# Patient Record
Sex: Male | Born: 1945
Health system: Southern US, Community
[De-identification: ages and names within clinical notes are randomized; demographics above are authoritative.]

## PROBLEM LIST (undated history)

## (undated) DIAGNOSIS — R200 Anesthesia of skin: Secondary | ICD-10-CM

## (undated) DIAGNOSIS — E7800 Pure hypercholesterolemia, unspecified: Secondary | ICD-10-CM

## (undated) DIAGNOSIS — K635 Polyp of colon: Secondary | ICD-10-CM

## (undated) DIAGNOSIS — Z800 Family history of malignant neoplasm of digestive organs: Secondary | ICD-10-CM

## (undated) DIAGNOSIS — Z524 Kidney donor: Secondary | ICD-10-CM

## (undated) DIAGNOSIS — I10 Essential (primary) hypertension: Secondary | ICD-10-CM

## (undated) HISTORY — PX: OTHER SURGICAL HISTORY: SHX169

## (undated) HISTORY — DX: Family history of malignant neoplasm of digestive organs: Z80.0

## (undated) HISTORY — DX: Polyp of colon: K63.5

## (undated) HISTORY — DX: Anesthesia of skin: R20.0

---

## 1994-04-24 HISTORY — PX: BACK SURGERY: SHX140

## 2000-11-09 ENCOUNTER — Ambulatory Visit: Admission: RE | Admit: 2000-11-09 | Discharge: 2000-11-09 | Payer: Self-pay | Admitting: Psychiatry

## 2001-04-24 DIAGNOSIS — Z524 Kidney donor: Secondary | ICD-10-CM

## 2001-04-24 HISTORY — PX: NEPHRECTOMY: SHX65

## 2001-04-24 HISTORY — DX: Kidney donor: Z52.4

## 2009-05-31 ENCOUNTER — Encounter: Payer: Self-pay | Admitting: Orthopedic Surgery

## 2009-06-02 ENCOUNTER — Ambulatory Visit: Payer: Self-pay | Admitting: Orthopedic Surgery

## 2009-06-02 DIAGNOSIS — M25569 Pain in unspecified knee: Secondary | ICD-10-CM

## 2009-06-02 DIAGNOSIS — M1710 Unilateral primary osteoarthritis, unspecified knee: Secondary | ICD-10-CM

## 2009-06-02 DIAGNOSIS — S83249A Other tear of medial meniscus, current injury, unspecified knee, initial encounter: Secondary | ICD-10-CM | POA: Insufficient documentation

## 2009-06-16 ENCOUNTER — Ambulatory Visit: Payer: Self-pay | Admitting: Orthopedic Surgery

## 2010-05-26 NOTE — Assessment & Plan Note (Signed)
Summary: RT KNEE PAIN,POPPING/NEEDS XRAYS/UHC/CAF   Vital Signs:  Patient profile:   65 year old male Pulse rate:   72 / minute Resp:     16 per minute  Vitals Entered By: Fuller Canada MD (June 02, 2009 11:11 AM)  Visit Type:  Initial Consult Referring Provider:  self Primary Provider:  Dr.Tapper  CC:  right knee pain.  History of Present Illness: I saw Timothy Schroeder in the office today for an initial visit.  He is a 65 years old man with the complaint of:  right knee pain.  Patient is on a camping trip he was on a sloped hill and doing some work.  Afterwards he noticed he couldn't bend his knee started having pain and swelling and he is now having a popping sensation.  His pain is moderate.  The swelling seems to be a little bit better.  Heat seemed to help more than ice.  The popping noise has him concerned.    Will have xrays today in our office.  Meds: Pravastatin, Lisinopril, Aspirin, Vitamin E, Fish oil, Vitamin B 12, Centrum Silver, Aleve as needed.  Allergies (verified): No Known Drug Allergies  Past History:  Past Medical History: htn cholesterol  Past Surgical History: back kidney donation  Family History: na  Social History: Patient is married.  retired no smoking one beer a week 2 cups of caffeine per day  Review of Systems General:  Denies weight loss, weight gain, fever, chills, and fatigue. Cardiac :  Denies chest pain, angina, heart attack, heart failure, poor circulation, blood clots, and phlebitis. Resp:  Denies short of breath, difficulty breathing, COPD, cough, and pneumonia. GI:  Denies nausea, vomiting, diarrhea, constipation, difficulty swallowing, ulcers, GERD, and reflux. GU:  Denies kidney failure, kidney transplant, kidney stones, burning, poor stream, testicular cancer, blood in urine, and . Neuro:  Complains of dizziness; denies headache, migraines, numbness, weakness, tremor, and unsteady walking. MS:  Complains of joint  pain; denies rheumatoid arthritis, joint swelling, gout, bone cancer, osteoporosis, and . Endo:  Denies thyroid disease, goiter, and diabetes. Psych:  Denies depression, mood swings, anxiety, panic attack, bipolar, and schizophrenia. Derm:  Denies eczema, cancer, and itching. EENT:  Denies poor vision, cataracts, glaucoma, poor hearing, vertigo, ears ringing, sinusitis, hoarseness, toothaches, and bleeding gums. Immunology:  Denies seasonal allergies, sinus problems, and allergic to bee stings. Lymphatic:  Denies lymph node cancer and lymph edema.  Physical Exam  Additional Exam:  vitals as recorded  Normal appearance  Peripheral vastus system observation normal palpation normal  Gait and station he is limping on the RIGHT side.  Inspection large joint effusion medial joint line tenderness lateral suprapatellar tenderness.  Flexion 115.  Stability normal.  Muscle strength and tone normal.  McMurray is negative, screw test positive.  Skin normal  Reflexes normal and both legs  Sensation normal  Mental status orientation normal mood and affect normal  Opposite LEFT knee has full range of motion strength is normal stability is normal and alignment is normal there is no effusion or tenderness. Development attrition normal body habitus medium deformities nonclimbing normal    Impression & Recommendations:  Problem # 1:  KNEE, ARTHRITIS, DEGEN./OSTEO (ICD-715.96) Assessment New  x-rays AP lateral patella RIGHT knee.  There is mild osteophytes and medial joint space narrowing is seen as well.  Impression mild osteoarthritis there is also some evidence of possible loose bodies in the medial compartment.  Recommend aspiration and injection RIGHT knee  We aspirated 20  cc of clear yellow fluid.  We injected cortisone.  Verbal consent was obtained. The knee was prepped with alcohol and ethyl chloride. 1 cc of depomedrol 40mg /cc and 4 cc of lidocaine 1% was injected. there were no  complications.  Orders: New Patient Level III [id number])  Problem # 2:  KNEE PAIN (ICD-719.46) Assessment: New  Orders: New Patient Level III (09811) Joint Aspirate / Injection, Large (20610) Depo- Medrol 40mg  (J1030) Knee x-ray,  3 views (91478)  Problem # 3:  DERANGEMENT MENISCUS (ICD-717.5) Assessment: New  Orders: New Patient Level III (29562) Joint Aspirate / Injection, Large (20610) Depo- Medrol 40mg  (J1030) Knee x-ray,  3 views (13086)  Patient Instructions: 1)  You have received an injection of cortisone today. You may experience increased pain at the injection site. Apply ice pack to the area for 20 minutes every 2 hours and take 2 xtra strength tylenol every 8 hours. This increased pain will usually resolve in 24 hours. The injection will take effect in 3-10 days.  2)  Ice 30 min nightly  3)  continue Aleve two times a day  4)  return in 2 weeks

## 2010-05-26 NOTE — Letter (Signed)
Summary: History form  History form   Imported By: Jacklynn Ganong 06/10/2009 08:10:40  _____________________________________________________________________  External Attachment:    Type:   Image     Comment:   External Document

## 2010-05-26 NOTE — Assessment & Plan Note (Signed)
Summary: 2 WK RE-CK RT KNEE/UHC/CAF   Visit Type:  Follow-up Referring Provider:  self Primary Provider:  Dr.Tapper  CC:  right knee pain.  History of Present Illness: I saw Timothy Schroeder in the office today for a 2 week  followup visit.  He is a 65 years old man with the complaint of:  right knee pain.  He says that his knee is a lot better   No catching locking or giving   EXAMINATION THE KNEE HAS NO SWELLING AND NO TENDERNESS  GAIT IS NORMAL  MCMURRAYS SIGN IS NEGATIVE  ROM IS FULL   IMPR: IMPROVED   PALN F/U as needed   Allergies: No Known Drug Allergies   Impression & Recommendations:  Problem # 1:  KNEE PAIN (ICD-719.46) Assessment Improved  Orders: Est. Patient Level II (78295)  Patient Instructions: 1)  Ice as needed  2)  aleve as needed  3)  f/u as needed

## 2012-09-15 ENCOUNTER — Encounter (HOSPITAL_COMMUNITY): Payer: Self-pay | Admitting: *Deleted

## 2012-09-15 ENCOUNTER — Emergency Department (HOSPITAL_COMMUNITY): Payer: Medicare Other

## 2012-09-15 ENCOUNTER — Emergency Department (HOSPITAL_COMMUNITY)
Admission: EM | Admit: 2012-09-15 | Discharge: 2012-09-15 | Disposition: A | Discharge status: Home / Self Care | Payer: Medicare Other | Attending: Emergency Medicine | Admitting: Emergency Medicine

## 2012-09-15 DIAGNOSIS — M25559 Pain in unspecified hip: Secondary | ICD-10-CM | POA: Insufficient documentation

## 2012-09-15 DIAGNOSIS — W57XXXA Bitten or stung by nonvenomous insect and other nonvenomous arthropods, initial encounter: Secondary | ICD-10-CM

## 2012-09-15 DIAGNOSIS — M130 Polyarthritis, unspecified: Secondary | ICD-10-CM

## 2012-09-15 HISTORY — DX: Essential (primary) hypertension: I10

## 2012-09-15 HISTORY — DX: Pure hypercholesterolemia, unspecified: E78.00

## 2012-09-15 HISTORY — DX: Kidney donor: Z52.4

## 2012-09-15 LAB — CBC WITH DIFFERENTIAL/PLATELET
Basophils Absolute: 0 10*3/uL (ref 0.0–0.1)
Basophils Relative: 0 % (ref 0–1)
Eosinophils Absolute: 0.1 10*3/uL (ref 0.0–0.7)
MCH: 32.2 pg (ref 26.0–34.0)
MCHC: 34.9 g/dL (ref 30.0–36.0)
Monocytes Absolute: 0.4 10*3/uL (ref 0.1–1.0)
Monocytes Relative: 7 % (ref 3–12)
Neutro Abs: 3.5 10*3/uL (ref 1.7–7.7)
Neutrophils Relative %: 59 % (ref 43–77)
RDW: 12.6 % (ref 11.5–15.5)

## 2012-09-15 LAB — COMPREHENSIVE METABOLIC PANEL
AST: 18 U/L (ref 0–37)
BUN: 24 mg/dL — ABNORMAL HIGH (ref 6–23)
CO2: 22 mEq/L (ref 19–32)
Calcium: 9.5 mg/dL (ref 8.4–10.5)
Chloride: 102 mEq/L (ref 96–112)
Creatinine, Ser: 1.28 mg/dL (ref 0.50–1.35)
GFR calc non Af Amer: 57 mL/min — ABNORMAL LOW (ref >90)
Total Bilirubin: 0.6 mg/dL (ref 0.3–1.2)

## 2012-09-15 MED ORDER — PREDNISONE 50 MG PO TABS: 50.0000 mg | ORAL_TABLET | Freq: Every day | ORAL | Status: DC

## 2012-09-15 MED ORDER — DOXYCYCLINE HYCLATE 100 MG PO CAPS: 100.0000 mg | ORAL_CAPSULE | Freq: Two times a day (BID) | ORAL | Status: DC

## 2012-09-15 MED ORDER — NAPROXEN 250 MG PO TABS
500.0000 mg | ORAL_TABLET | Freq: Once | ORAL | Status: AC
Start: 2012-09-15 — End: 2012-09-15
  Administered 2012-09-15: 500 mg via ORAL
  Filled 2012-09-15: qty 2

## 2012-09-15 MED ORDER — NAPROXEN 500 MG PO TABS: 500.0000 mg | ORAL_TABLET | Freq: Two times a day (BID) | ORAL | Status: DC

## 2012-09-15 MED ORDER — DOXYCYCLINE HYCLATE 100 MG PO TABS
100.0000 mg | ORAL_TABLET | Freq: Once | ORAL | Status: AC
  Administered 2012-09-15: 100 mg via ORAL
  Filled 2012-09-15: qty 1

## 2012-09-15 NOTE — ED Provider Notes (Signed)
History    This chart was scribed for Dione Booze, MD by Leone Payor, ED Scribe. This patient was seen in room [facility]18/[facility]18 and the patient's care was started 10:31 AM.   CSN: [id number]  Arrival date & time 09/15/12  1016   First MD Initiated Contact with Patient 09/15/12 1024      Chief Complaint  Patient presents with  . Joint Pain     The history is provided by the patient. No language interpreter was used.    HPI Comments: Timothy Schroeder is a 67 y.o. male who presents to the Emergency Department complaining of constant, gradually worsening joint pain to bilateral knees and L hip starting 3 days ago. He denies any recent injury or trauma. Pt states he was bitten by a tick on May 7. States 3 days ago the pain was equal in both knees and L hip but the hip pain has decreased and the bilateral knee pain has increased. Reports it does not hurt to touch but has pain from within the joints. He has taken 4 aleve daily with moderate relief. Rates pain as 5/10 currently and 9/10 at max. Standing and bearing weight aggravates the pain.  He denies fever, rashes, HA  No past medical history on file.  No past surgical history on file.  No family history on file.  History  Substance Use Topics  . Smoking status: Not on file  . Smokeless tobacco: Not on file  . Alcohol Use: Not on file      Review of Systems  Constitutional: Negative for fever.  Musculoskeletal: Positive for arthralgias. Negative for joint swelling.  Skin: Negative for rash.  Neurological: Negative for headaches.  All other systems reviewed and are negative.    Allergies  Review of patient's allergies indicates not on file.  Home Medications  No current outpatient prescriptions on file.  BP 135/74  Pulse 70  Temp(Src) 98.3 F (36.8 C) (Oral)  Resp 16  SpO2 96%  Physical Exam  Nursing note and vitals reviewed. Constitutional: He is oriented to person, place, and time. He appears well-developed and  well-nourished. No distress.  HENT:  Head: Normocephalic and atraumatic.  Eyes: EOM are normal.  Neck: Neck supple. No tracheal deviation present.  Cardiovascular: Normal rate, regular rhythm and normal heart sounds.   Pulmonary/Chest: Effort normal and breath sounds normal. No respiratory distress. He has no wheezes. He has no rales. He exhibits no tenderness.  Musculoskeletal: Normal range of motion.  Small supra-patelar effusion present bilaterally  No instability of knees, negative Lachman and McMurray tests. Full ROM of L hip without pain. Full ROM of both knees. No tenderness to palpation of hips or knees.   Neurological: He is alert and oriented to person, place, and time.  Skin: Skin is warm and dry.  Psychiatric: He has a normal mood and affect. His behavior is normal.    ED Course  Procedures (including critical care time)  DIAGNOSTIC STUDIES: Oxygen Saturation is 96% on room air, adequate by my interpretation.    COORDINATION OF CARE: 10:37 AM Discussed treatment plan with pt at bedside and pt agreed to plan.   Results for orders placed during the hospital encounter of 09/15/12  CBC WITH DIFFERENTIAL      Result Value Range   WBC 5.9  4.0 - 10.5 K/uL   RBC 4.81  4.22 - 5.81 MIL/uL   Hemoglobin 15.5  13.0 - 17.0 g/dL   HCT 40.9  81.1 - 91.4 %  MCV 92.3  78.0 - 100.0 fL   MCH 32.2  26.0 - 34.0 pg   MCHC 34.9  30.0 - 36.0 g/dL   RDW 16.1  09.6 - 04.5 %   Platelets 196  150 - 400 K/uL   Neutrophils Relative % 59  43 - 77 %   Neutro Abs 3.5  1.7 - 7.7 K/uL   Lymphocytes Relative 33  12 - 46 %   Lymphs Abs 1.9  0.7 - 4.0 K/uL   Monocytes Relative 7  3 - 12 %   Monocytes Absolute 0.4  0.1 - 1.0 K/uL   Eosinophils Relative 2  0 - 5 %   Eosinophils Absolute 0.1  0.0 - 0.7 K/uL   Basophils Relative 0  0 - 1 %   Basophils Absolute 0.0  0.0 - 0.1 K/uL  SEDIMENTATION RATE      Result Value Range   Sed Rate 12  0 - 16 mm/hr  COMPREHENSIVE METABOLIC PANEL      Result  Value Range   Sodium 135  135 - 145 mEq/L   Potassium 4.6  3.5 - 5.1 mEq/L   Chloride 102  96 - 112 mEq/L   CO2 22  19 - 32 mEq/L   Glucose, Bld 95  70 - 99 mg/dL   BUN 24 (*) 6 - 23 mg/dL   Creatinine, Ser 4.09  0.50 - 1.35 mg/dL   Calcium 9.5  8.4 - 81.1 mg/dL   Total Protein 7.7  6.0 - 8.3 g/dL   Albumin 4.2  3.5 - 5.2 g/dL   AST 18  0 - 37 U/L   ALT 16  0 - 53 U/L   Alkaline Phosphatase 88  39 - 117 U/L   Total Bilirubin 0.6  0.3 - 1.2 mg/dL   GFR calc non Af Amer 57 (*) >90 mL/min   GFR calc Af Amer 66 (*) >90 mL/min   Dg Hip Complete Left  09/15/2012   *RADIOLOGY REPORT*  Clinical Data: Left hip and bilateral knee pain, status post tick bite  LEFT HIP - COMPLETE 2+ VIEW  Comparison: None.  Findings: No fracture or dislocation is seen.  The bilateral hip joint spaces are preserved.  Visualized bony pelvis appears intact.  IMPRESSION: No acute osseous abnormality is seen.   Original Report Authenticated By: Charline Bills, M.D.   Dg Knee Complete 4 Views Left  09/15/2012   *RADIOLOGY REPORT*  Clinical Data: Tick bite 3 days ago, now with left knee pain  LEFT KNEE - COMPLETE 4+ VIEW  Comparison: None.  Findings:  No fracture or dislocation.  Mild tricompartmental degenerative change with joint space loss, subchondral sclerosis and osteophytosis.  No evidence of chondrocalcinosis.  No suprapatellar joint effusion.  There is minimal enthesopathic change of the quadriceps tendon insertion site.  Regional soft tissues are normal.  No radiopaque foreign body.  IMPRESSION: 1.  No acute findings. 2.  Mild tricompartmental degenerative change.   Original Report Authenticated By: Tacey Ruiz, MD   Dg Knee Complete 4 Views Right  09/15/2012   *RADIOLOGY REPORT*  Clinical Data: Left hip and bilateral knee pain, status post tick bite  RIGHT KNEE - COMPLETE 4+ VIEW  Comparison: None.  Findings: No fracture or dislocation is seen.  Mild to moderate tricompartmental degenerative changes, most  prominent in the patellofemoral compartment.  The visualized soft tissues are unremarkable.  No suprapatellar knee joint effusion.  IMPRESSION: Mild to moderate degenerative changes.   Original Report  Authenticated By: Charline Bills, M.D.      1. Polyarthritis   2. Tick bite      MDM  Polyarthritis of uncertain cause. Recent tick bite would be concerning for possible [facility] spotted fever and possible Lyme disease. Screening labs will be obtained and recommend spotted fever titer obtained.  Screening labs are unremarkable including normal sedimentation rate. He'll be treated empirically with naproxen and prednisone and also was started on empiric treatment for possible [facility] spotted fever with doxycycline. He is referred back to his PCP for reevaluation in 5 days.    I personally performed the services described in this documentation, which was scribed in my presence. The recorded information has been reviewed and is accurate.     Dione Booze, MD 09/15/12 1352

## 2012-09-15 NOTE — ED Notes (Signed)
Pt states joint pain to both knees and left hip. No known injury. Tick bite on May 7th.

## 2012-09-17 LAB — ROCKY MTN SPOTTED FVR AB, IGG-BLOOD: RMSF IgG: 1.02 IV — ABNORMAL HIGH

## 2012-09-17 LAB — ROCKY MTN SPOTTED FVR AB, IGM-BLOOD: RMSF IgM: 0.47 IV (ref 0.00–0.89)

## 2012-09-18 NOTE — ED Notes (Signed)
+   RMSF patient treated with Doxycyline

## 2012-11-27 ENCOUNTER — Other Ambulatory Visit: Payer: Self-pay

## 2012-12-17 ENCOUNTER — Ambulatory Visit (INDEPENDENT_AMBULATORY_CARE_PROVIDER_SITE_OTHER): Payer: Medicare Other

## 2012-12-17 ENCOUNTER — Encounter: Payer: Self-pay | Admitting: Orthopedic Surgery

## 2012-12-17 ENCOUNTER — Ambulatory Visit (INDEPENDENT_AMBULATORY_CARE_PROVIDER_SITE_OTHER): Payer: Medicare Other | Admitting: Orthopedic Surgery

## 2012-12-17 VITALS — BP 130/79 | Ht 71.0 in | Wt 225.0 lb

## 2012-12-17 DIAGNOSIS — M75101 Unspecified rotator cuff tear or rupture of right shoulder, not specified as traumatic: Secondary | ICD-10-CM

## 2012-12-17 DIAGNOSIS — M25511 Pain in right shoulder: Secondary | ICD-10-CM

## 2012-12-17 DIAGNOSIS — S43429A Sprain of unspecified rotator cuff capsule, initial encounter: Secondary | ICD-10-CM

## 2012-12-17 DIAGNOSIS — M25519 Pain in unspecified shoulder: Secondary | ICD-10-CM

## 2012-12-17 NOTE — Patient Instructions (Addendum)
Start Therapy Joint Injection Care After Refer to this sheet in the next few days. These instructions provide you with information on caring for yourself after you have had a joint injection. Your caregiver also may give you more specific instructions. Your treatment has been planned according to current medical practices, but problems sometimes occur. Call your caregiver if you have any problems or questions after your procedure. After any type of joint injection, it is not uncommon to experience:  Soreness, swelling, or bruising around the injection site.  Mild numbness, tingling, or weakness around the injection site caused by the numbing medicine used before or with the injection. It also is possible to experience the following effects associated with the specific agent after injection:  Iodine-based contrast agents:  Allergic reaction (itching, hives, widespread redness, and swelling beyond the injection site).  Corticosteroids (These effects are rare.):  Allergic reaction.  Increased blood sugar levels (If you have diabetes and you notice that your blood sugar levels have increased, notify your caregiver).  Increased blood pressure levels.  Mood swings.  Hyaluronic acid in the use of viscosupplementation.  Temporary heat or redness.  Temporary rash and itching.  Increased fluid accumulation in the injected joint. These effects all should resolve within a day after your procedure.  HOME CARE INSTRUCTIONS  Limit yourself to light activity the day of your procedure. Avoid lifting heavy objects, bending, stooping, or twisting.  Take prescription or over-the-counter pain medication as directed by your caregiver.  You may apply ice to your injection site to reduce pain and swelling the day of your procedure. Ice may be applied 3-4 times:  Put ice in a plastic bag.  Place a towel between your skin and the bag.  Leave the ice on for no longer than 15-20 minutes each  time. SEEK IMMEDIATE MEDICAL CARE IF:   Pain and swelling get worse rather than better or extend beyond the injection site.  Numbness does not go away.  Blood or fluid continues to leak from the injection site.  You have chest pain.  You have swelling of your face or tongue.  You have trouble breathing or you become dizzy.  You develop a fever, chills, or severe tenderness at the injection site that last longer than 1 day. MAKE SURE YOU:  Understand these instructions.  Watch your condition.  Get help right away if you are not doing well or if you get worse. Document Released: 12/22/2010 Document Revised: 07/03/2011 Document Reviewed: 12/22/2010 [facility] Patient Information 2014 [employer], [place].

## 2012-12-17 NOTE — Progress Notes (Signed)
Patient ID: Timothy Schroeder, male   DOB: 1946-03-31, 67 y.o.   MRN: [id number]  Chief Complaint  Patient presents with  . Shoulder Pain    Right shoulder pain d/t injury 10/30/12    Today nevus 67 year old male with medical history of hypertension high cholesterol, status post back surgery in 1996 status post kidney donation in 2003 who presents with a traumatic injury to the right shoulder on July 9. He complains of sharp 10 intermittent pain which is worse at night and worse when he tried to pick something up. He denies numbness and tingling bruising in the upper arm locking catching or swelling. He has not had any treatment  His review of systems is positive for joint pain denies chest pain shortness of breath heartburn blurred vision unexpected weight loss skin changes numbness tingling nervousness and bleeding thirst or allergies  No known drug allergies  Medications lisinopril 20 pravastatin 40 Naprosyn 500 twice a day Centrum Silver 81 mg aspirin fish oil twice a day saw palmetto once a day  Reports negative family history  Social history married retired does not smoke has maybe 2 beers a week  Family physician Dr. Margo Common   pharmacy [facility] [place]  BP 130/79  Ht 5\' 11"  (1.803 m)  Wt 225 lb (102.059 kg)  BMI 31.39 kg/m2 General appearance is normal, the patient is alert and oriented x3 with normal mood and affect. Gait is normal  Distal pulses are intact he has normal sensation is no lymphadenopathy and reflexes are equal and 2+ at the elbow and wrist  The cervical spine shows no tenderness or swelling. No restriction of motion no muscle atrophy or muscle tension skin is intact without rash lesion or ulceration  Left shoulder exhibits full range of motion. There is no instability motor exam is normal skin is intact no swelling or tenderness  Right shoulder reveals:  Inspect: No bruising or swelling there is tenderness in the rotator interval ROM: Passive range of motion is  normal Stability: Normal Motor: Weakness in the supraspinatus tendon Skin: No rashes Provoking Tests: Positive impingement sign and he has weakness against resistance with the empty can sign  X-rays ordered: right shoulder   Results: A.c. joint arthritis large grade 3 a.c. joint DX: Partial rotator cuff tear   PLAN:  Subacromial injection  physical therapy  return in 4 weeks if no better MRI to plan for surgery  Shoulder Injection Procedure Note   Pre-operative Diagnosis: right  RC Syndrome  Post-operative Diagnosis: same  Indications: pain   Anesthesia: ethyl chloride   Procedure Details   Verbal consent was obtained for the procedure. The shoulder was prepped withalcohol and the skin was anesthetized. A 20 gauge needle was advanced into the subacromial space through posterior approach without difficulty  The space was then injected with 3 ml 1% lidocaine and 1 ml of depomedrol. The injection site was cleansed with isopropyl alcohol and a dressing was applied.  Complications:  None; patient tolerated the procedure well.

## 2012-12-30 ENCOUNTER — Telehealth (HOSPITAL_COMMUNITY): Payer: Self-pay

## 2012-12-30 ENCOUNTER — Ambulatory Visit (HOSPITAL_COMMUNITY): Payer: Medicare Other | Admitting: Specialist

## 2013-01-06 ENCOUNTER — Ambulatory Visit (HOSPITAL_COMMUNITY)
Admission: RE | Admit: 2013-01-06 | Discharge: 2013-01-06 | Disposition: A | Discharge status: Home / Self Care | Payer: Medicare Other | Source: Ambulatory Visit | Attending: Orthopedic Surgery | Admitting: Orthopedic Surgery

## 2013-01-06 DIAGNOSIS — M25511 Pain in right shoulder: Secondary | ICD-10-CM | POA: Insufficient documentation

## 2013-01-06 DIAGNOSIS — IMO0001 Reserved for inherently not codable concepts without codable children: Secondary | ICD-10-CM | POA: Insufficient documentation

## 2013-01-06 DIAGNOSIS — M75111 Incomplete rotator cuff tear or rupture of right shoulder, not specified as traumatic: Secondary | ICD-10-CM | POA: Insufficient documentation

## 2013-01-06 DIAGNOSIS — M25519 Pain in unspecified shoulder: Secondary | ICD-10-CM | POA: Insufficient documentation

## 2013-01-06 DIAGNOSIS — M25619 Stiffness of unspecified shoulder, not elsewhere classified: Secondary | ICD-10-CM | POA: Insufficient documentation

## 2013-01-06 NOTE — Evaluation (Signed)
Occupational Therapy Evaluation  Patient Details  Name: Timothy Schroeder MRN: [id number] Date of Birth: 11/17/1945  Today's Date: 01/06/2013 Time: 0850-0920 OT Time Calculation (min): 30 min OT Eval 850-910 20' Manual Therapy 10' Visit#: 1 of 12  Re-eval: 02/03/13  Assessment Diagnosis: Right Shoulder Partial Rotator Cuff Tear Next MD Visit: unknown Prior Therapy: n/a  Authorization: Medicare  Authorization Time Period: before 10th visit  Authorization Visit#: 1 of 10   Past Medical History:  Past Medical History  Diagnosis Date  . Hypertension   . Hypercholesteremia   . Kidney donor    Past Surgical History:  Past Surgical History  Procedure Laterality Date  . Nephrectomy      Left  . Back surgery      Subjective  S:  I was hanging clothing back in the closet after having our floors refinished, and I felt a tearing sensation in my right shoulder. Pertinent History: [name] states that a few months ago he was hanging clothing up in his closet, after having his floors refinished and felt a tearing sensation in his right shoulder.  He consulted with Dr. Romeo Apple and was referred to occupational therapy for evaluation and treatment.   Special Tests: FOTO scored 56/100. Patient Stated Goals: I want my shoulder back to normal. Pain Assessment Currently in Pain?: Yes Pain Score: 4  Pain Location: Shoulder Pain Orientation: Right;Anterior Pain Type: Acute pain  Precautions/Restrictions  Precautions Precautions: None  Balance Screening Balance Screen Has the patient fallen in the past 6 months: No Has the patient had a decrease in activity level because of a fear of falling? : Yes Is the patient reluctant to leave their home because of a fear of falling? : Yes  Prior Functioning  Prior Function Vocation: Retired Comments: I with all B/IADLs, driving, leisure activities include taking care of grandchildren.  Lives with his wife and active in his  church  Assessment ADL/Vision/Perception ADL ADL Comments: diffculty and increased pain when reaching overhead, or lifting items from waist to shoulder with flexed shoulder and extended elbow Dominant Hand: Right Vision - History Baseline Vision: Wears glasses all the time  Cognition/Observation Cognition Overall Cognitive Status: Within Functional Limits for tasks assessed  Sensation/Coordination/Edema Sensation Light Touch: Appears Intact Coordination Gross Motor Movements are Fluid and Coordinated: Yes Fine Motor Movements are Fluid and Coordinated: Yes  Additional Assessments RUE AROM (degrees) RUE Overall AROM Comments: assessed in seated, ER/IR with shoulder abducted Right Shoulder Flexion: 155 Degrees Right Shoulder ABduction: 145 Degrees Right Shoulder Internal Rotation: 70 Degrees Right Shoulder External Rotation: 70 Degrees RUE PROM (degrees) RUE Overall PROM Comments: WFL RUE Strength RUE Overall Strength Comments: assessed in seated, ER/IR with shoulder abducted to 90 Right Shoulder Flexion:  (4-/5) Right Shoulder ABduction: 4/5 Right Shoulder Internal Rotation: 4/5 Right Shoulder External Rotation: 4/5 Palpation Palpation: moderate fascial restrictions in right shoulder, upper arm, scapular region.      Exercise/Treatments     Manual Therapy Manual Therapy: Myofascial release Myofascial Release: MFR and manual stretching to right shoulder, upper arm, scapular, and trapezius region and associated areas to decrease pain and restrictions and improve pain free mobility in right shoulder region.    Occupational Therapy Assessment and Plan OT Assessment and Plan Clinical Impression Statement: A:  Patient presents with increased pain and decreased mobility and strength in his right shoulder due to possible partial rotator cuff tear.  These deficits are limiting his ability to complete his daily activities and leisure activities in his  normal capacity.   Pt will  benefit from skilled therapeutic intervention in order to improve on the following deficits: Decreased strength;Decreased range of motion;Increased fascial restricitons;Increased muscle spasms;Pain Rehab Potential: Good OT Frequency: Min 2X/week OT Duration: 6 weeks OT Treatment/Interventions: Self-care/ADL training;Therapeutic exercise;Manual therapy;Therapeutic activities;Patient/family education OT Plan: P: Skilled OT intervention to decrease pain and fascial restrictions and improve pain free mobility and strength in right shoulder needed to return to prior level of independence.  Treatment Plan:  MFR and manual stretching in supine,  AAROM progress to AROM as tolerated.  scapular strengthening exercises, wall wash, tband, ball stretches.    Goals Short Term Goals Time to Complete Short Term Goals: 3 weeks Short Term Goal 1: Patient will be educated on a HEP. Short Term Goal 2: Patient will improve AROM by 10 degrees in right shoulder for increased ability to reach into overhead shelves. Short Term Goal 3: Patient will improve right shoulder strength to 4+/5 for increased ability to lift bags of groceries. Short Term Goal 4: Patient will decrease pain to 2/10 or less in his right shoulder when completing daily activities.  Short Term Goal 5: Patient will decrease fascial restrictions to minimal in his right shoulder.  Long Term Goals Time to Complete Long Term Goals: 6 weeks Long Term Goal 1: Pateint will return to prior level of independence with all daily activities, leisure activities.  Long Term Goal 2: Patient will have WNL AROM in right shoulder in order to reach overhead without difficulty. Long Term Goal 3: Patient will have 5/5 strength in his right shoulder for increased independence with lifting yard tools.  Long Term Goal 4: Patient will have 0 pain in his right shoulder during functional activities.  Long Term Goal 4 Progress: Progressing toward goal Long Term Goal 5: Patient  will have trace fascial restrictions in his right shoulder region.   Problem List Patient Active Problem List   Diagnosis Date Noted  . Partial tear of right rotator cuff 01/06/2013  . Acute pain of right shoulder 01/06/2013  . KNEE, ARTHRITIS, DEGEN./OSTEO 06/02/2009  . DERANGEMENT MENISCUS 06/02/2009  . KNEE PAIN 06/02/2009    End of Session Activity Tolerance: Patient tolerated treatment well General Behavior During Therapy: [facility] for tasks assessed/performed OT Plan of Care OT Home Exercise Plan: shoulder stretches OT Patient Instructions: scanned  Consulted and Agree with Plan of Care: Patient  GO Functional Assessment Tool Used: FOTO score 56% Independent and 44% Impaired Functional Limitation: Carrying, moving and handling objects Carrying, Moving and Handling Objects Current Status (W0981): At least 40 percent but less than 60 percent impaired, limited or restricted Carrying, Moving and Handling Objects Goal Status [phone]): At least 1 percent but less than 20 percent impaired, limited or restricted  Shirlean Mylar, OTR/L  01/06/2013, 11:36 AM  Physician Documentation Your signature is required to indicate approval of the treatment plan as stated above.  Please sign and either send electronically or make a copy of this report for your files and return this physician signed original.  Please mark one 1.__approve of plan  2. ___approve of plan with the following conditions.   ______________________________                                                          _____________________ Physician Signature  Date  

## 2013-01-10 ENCOUNTER — Ambulatory Visit (HOSPITAL_COMMUNITY)
Admission: RE | Admit: 2013-01-10 | Discharge: 2013-01-10 | Disposition: A | Discharge status: Home / Self Care | Payer: Medicare Other | Source: Ambulatory Visit | Attending: Orthopedic Surgery | Admitting: Orthopedic Surgery

## 2013-01-10 DIAGNOSIS — M25511 Pain in right shoulder: Secondary | ICD-10-CM

## 2013-01-10 DIAGNOSIS — M75111 Incomplete rotator cuff tear or rupture of right shoulder, not specified as traumatic: Secondary | ICD-10-CM

## 2013-01-10 NOTE — Progress Notes (Signed)
Occupational Therapy Treatment Patient Details  Name: Timothy Schroeder MRN: [id number] Date of Birth: Nov 05, 1945  Today's Date: 01/10/2013 Time: 0865-7846 OT Time Calculation (min): 43 min MFR 847-856 9' Therex 856-930 34'   Visit#: 2 of 12  Re-eval: 02/03/13    Authorization: Medicare  Authorization Time Period: before 10th visit  Authorization Visit#: 2 of 10  Subjective Symptoms/Limitations Symptoms: S: My shoulder doesn't hurt unless I move it in a some strange way.  Pain Assessment Currently in Pain?: No/denies  Precautions/Restrictions  Precautions Precautions: None  Exercise/Treatments Supine Protraction: PROM;AAROM;10 reps Horizontal ABduction: PROM;AAROM;10 reps External Rotation: PROM;AAROM;10 reps Internal Rotation: PROM;AAROM;10 reps Flexion: PROM;AAROM;10 reps ABduction: PROM;AAROM;10 reps Seated Elevation: AROM;15 reps Extension: AROM;15 reps Row: AROM;15 reps Therapy Ball Flexion: 15 reps ABduction: 15 reps ROM / Strengthening / Isometric Strengthening Thumb Tacks: 1'        Manual Therapy Manual Therapy: Myofascial release Myofascial Release: MFR and manual stretching to right shoulder, upper arm, scapular, and trapezius region and associated areas to decrease pain and restrictions and improve pain free mobility in right shoulder region  Occupational Therapy Assessment and Plan OT Assessment and Plan Clinical Impression Statement: A: Patient tolerated all excercises well wtih some "catching" in rotator cuff during supine PROM horizontal abduction. OT Plan: P: Add pro/ret/elev/dep, pulleys, and AAROM seated.   Goals Short Term Goals Time to Complete Short Term Goals: 3 weeks Short Term Goal 1: Patient will be educated on a HEP. Short Term Goal 1 Progress: Progressing toward goal Short Term Goal 2: Patient will improve AROM by 10 degrees in right shoulder for increased ability to reach into overhead shelves. Short Term Goal 2 Progress:  Progressing toward goal Short Term Goal 3: Patient will improve right shoulder strength to 4+/5 for increased ability to lift bags of groceries. Short Term Goal 3 Progress: Progressing toward goal Short Term Goal 4: Patient will decrease pain to 2/10 or less in his right shoulder when completing daily activities.  Short Term Goal 4 Progress: Progressing toward goal Short Term Goal 5: Patient will decrease fascial restrictions to minimal in his right shoulder.  Short Term Goal 5 Progress: Progressing toward goal Long Term Goals Time to Complete Long Term Goals: 6 weeks Long Term Goal 1: Pateint will return to prior level of independence with all daily activities, leisure activities.  Long Term Goal 1 Progress: Progressing toward goal Long Term Goal 2: Patient will have WNL AROM in right shoulder in order to reach overhead without difficulty. Long Term Goal 2 Progress: Progressing toward goal Long Term Goal 3: Patient will have 5/5 strength in his right shoulder for increased independence with lifting yard tools.  Long Term Goal 3 Progress: Progressing toward goal Long Term Goal 4: Patient will have 0 pain in his right shoulder during functional activities.  Long Term Goal 4 Progress: Progressing toward goal Long Term Goal 5: Patient will have trace fascial restrictions in his right shoulder region.  Long Term Goal 5 Progress: Progressing toward goal  Problem List Patient Active Problem List   Diagnosis Date Noted  . Partial tear of right rotator cuff 01/06/2013  . Acute pain of right shoulder 01/06/2013  . KNEE, ARTHRITIS, DEGEN./OSTEO 06/02/2009  . DERANGEMENT MENISCUS 06/02/2009  . KNEE PAIN 06/02/2009    End of Session Activity Tolerance: Patient tolerated treatment well General Behavior During Therapy: [facility] for tasks assessed/performed OT Plan of Care OT Home Exercise Plan: shoulder stretches OT Patient Instructions: scanned  Consulted and Agree with Plan  of Care:  Patient   Limmie Patricia, OTR/L,CBIS   01/10/2013, 11:54 AM

## 2013-01-13 ENCOUNTER — Ambulatory Visit (HOSPITAL_COMMUNITY)
Admission: RE | Admit: 2013-01-13 | Discharge: 2013-01-13 | Disposition: A | Discharge status: Home / Self Care | Payer: Medicare Other | Source: Ambulatory Visit | Attending: Orthopedic Surgery | Admitting: Orthopedic Surgery

## 2013-01-13 DIAGNOSIS — M25511 Pain in right shoulder: Secondary | ICD-10-CM

## 2013-01-13 DIAGNOSIS — M75111 Incomplete rotator cuff tear or rupture of right shoulder, not specified as traumatic: Secondary | ICD-10-CM

## 2013-01-13 NOTE — Progress Notes (Signed)
Occupational Therapy Treatment Patient Details  Name: ELCHANAN BOB MRN: [id number] Date of Birth: 1946-02-09  Today's Date: 01/13/2013 Time: 4098-1191 OT Time Calculation (min): 43 min MFR 478-295 18' Therex 905-930 25'  Visit#: 3 of 12  Re-eval: 02/03/13    Authorization: Medicare  Authorization Time Period: before 10th visit  Authorization Visit#: 3 of 10  Subjective Symptoms/Limitations Symptoms: S: My shoulder feels good today unless I move it wrong.  Pain Assessment Currently in Pain?: No/denies  Precautions/Restrictions  Precautions Precautions: None  Exercise/Treatments Supine Protraction: PROM;10 reps;AAROM;12 reps Horizontal ABduction: PROM;10 reps;AAROM;12 reps External Rotation: PROM;10 reps;AAROM;12 reps Internal Rotation: PROM;10 reps;AAROM;12 reps Flexion: PROM;10 reps;AAROM;12 reps ABduction: PROM;10 reps;AAROM;12 reps Seated Elevation: AROM;15 reps Extension: AROM;15 reps Row: AROM;15 reps Protraction: AAROM;12 reps Horizontal ABduction: AAROM;10 reps External Rotation: AAROM;12 reps Internal Rotation: AAROM;12 reps Flexion: AAROM;12 reps Abduction: AAROM;12 reps Therapy Ball Flexion: 15 reps ABduction: 15 reps ROM / Strengthening / Isometric Strengthening Thumb Tacks: 1'       Manual Therapy Manual Therapy: Myofascial release Myofascial Release: MFR and manual stretching to right shoulder, upper arm, scapular, and trapezius region and associated areas to decrease pain and restrictions and improve pain free mobility in right shoulder region  Occupational Therapy Assessment and Plan OT Assessment and Plan Clinical Impression Statement: A: Increased reps with AAROM supine, added AAROM seated. Patient tolerated well. Difficulty performing AAROM seated horizontal abduction. Patient needed to rest several times during movement.  OT Plan: P: Add pro/ret/elev/dep and pulleys.   Goals Short Term Goals Time to Complete Short Term Goals: 3  weeks Short Term Goal 1: Patient will be educated on a HEP. Short Term Goal 1 Progress: Progressing toward goal Short Term Goal 2: Patient will improve AROM by 10 degrees in right shoulder for increased ability to reach into overhead shelves. Short Term Goal 2 Progress: Progressing toward goal Short Term Goal 3: Patient will improve right shoulder strength to 4+/5 for increased ability to lift bags of groceries. Short Term Goal 3 Progress: Progressing toward goal Short Term Goal 4: Patient will decrease pain to 2/10 or less in his right shoulder when completing daily activities.  Short Term Goal 4 Progress: Progressing toward goal Short Term Goal 5: Patient will decrease fascial restrictions to minimal in his right shoulder.  Short Term Goal 5 Progress: Progressing toward goal Long Term Goals Time to Complete Long Term Goals: 6 weeks Long Term Goal 1: Pateint will return to prior level of independence with all daily activities, leisure activities.  Long Term Goal 1 Progress: Progressing toward goal Long Term Goal 2: Patient will have WNL AROM in right shoulder in order to reach overhead without difficulty. Long Term Goal 2 Progress: Progressing toward goal Long Term Goal 3: Patient will have 5/5 strength in his right shoulder for increased independence with lifting yard tools.  Long Term Goal 3 Progress: Progressing toward goal Long Term Goal 4: Patient will have 0 pain in his right shoulder during functional activities.  Long Term Goal 4 Progress: Progressing toward goal Long Term Goal 5: Patient will have trace fascial restrictions in his right shoulder region.  Long Term Goal 5 Progress: Progressing toward goal  Problem List Patient Active Problem List   Diagnosis Date Noted  . Partial tear of right rotator cuff 01/06/2013  . Acute pain of right shoulder 01/06/2013  . KNEE, ARTHRITIS, DEGEN./OSTEO 06/02/2009  . DERANGEMENT MENISCUS 06/02/2009  . KNEE PAIN 06/02/2009    End of  Session Activity Tolerance: Patient  tolerated treatment well General Behavior During Therapy: [facility] for tasks assessed/performed   Limmie Patricia, OTR/L,CBIS   01/13/2013, 9:35 AM

## 2013-01-14 ENCOUNTER — Ambulatory Visit (INDEPENDENT_AMBULATORY_CARE_PROVIDER_SITE_OTHER): Payer: Medicare Other | Admitting: Orthopedic Surgery

## 2013-01-14 ENCOUNTER — Encounter: Payer: Self-pay | Admitting: Orthopedic Surgery

## 2013-01-14 VITALS — BP 130/79 | Ht 71.0 in | Wt 225.0 lb

## 2013-01-14 DIAGNOSIS — M75111 Incomplete rotator cuff tear or rupture of right shoulder, not specified as traumatic: Secondary | ICD-10-CM

## 2013-01-14 DIAGNOSIS — M75101 Unspecified rotator cuff tear or rupture of right shoulder, not specified as traumatic: Secondary | ICD-10-CM | POA: Insufficient documentation

## 2013-01-14 DIAGNOSIS — S43429A Sprain of unspecified rotator cuff capsule, initial encounter: Secondary | ICD-10-CM

## 2013-01-14 DIAGNOSIS — M67919 Unspecified disorder of synovium and tendon, unspecified shoulder: Secondary | ICD-10-CM

## 2013-01-14 NOTE — Progress Notes (Signed)
Patient ID: Timothy Schroeder, male   DOB: 1946-03-17, 67 y.o.   MRN: [id number]  Chief Complaint  Patient presents with  . Follow-up    recheck right shoulder s/p injection and therapy    BP 130/79  Ht 5\' 11"  (1.803 m)  Wt 225 lb (102.059 kg)  BMI 31.39 kg/m2  This is a 67 year old male status post injection and physical therapy right shoulder with major improvement with only slight discomfort at this time  He has good strength in the shoulder to manual muscle testing  He is advised continue therapy convert to a home program followup if recurrence

## 2013-01-14 NOTE — Patient Instructions (Addendum)
Continue therapy

## 2013-01-17 ENCOUNTER — Ambulatory Visit (HOSPITAL_COMMUNITY)
Admission: RE | Admit: 2013-01-17 | Discharge: 2013-01-17 | Disposition: A | Discharge status: Home / Self Care | Payer: Medicare Other | Source: Ambulatory Visit | Attending: Orthopedic Surgery | Admitting: Orthopedic Surgery

## 2013-01-17 DIAGNOSIS — M25511 Pain in right shoulder: Secondary | ICD-10-CM

## 2013-01-17 DIAGNOSIS — M75111 Incomplete rotator cuff tear or rupture of right shoulder, not specified as traumatic: Secondary | ICD-10-CM

## 2013-01-17 NOTE — Progress Notes (Signed)
Occupational Therapy Treatment Patient Details  Name: Timothy Schroeder MRN: [id number] Date of Birth: January 04, 1946  Today's Date: 01/17/2013 Time: 4696-2952 OT Time Calculation (min): 45 min Manual Therapy 854-910  16' Therapeutic exercise 910-939 29' Visit#: 4 of 12  Re-eval: 02/03/13    Authorization: Medicare  Authorization Time Period: before 10th visit  Authorization Visit#: 4 of 10  Subjective S:  Im not feeling too much pain at all, its really just positional. Pain Assessment Currently in Pain?: No/denies Pain Score: 0-No pain  Precautions/Restrictions   progress as tolerated  Exercise/Treatments Supine Protraction: PROM;10 reps;AROM;15 reps Horizontal ABduction: PROM;AROM;10 reps External Rotation: PROM;AROM;10 reps Internal Rotation: PROM;AROM;10 reps Flexion: PROM;AROM;10 reps ABduction: PROM;AROM;10 reps Seated Elevation: AROM;15 reps Extension: AROM;15 reps Row: AROM;15 reps Protraction: AROM;10 reps Horizontal ABduction: AROM;10 reps External Rotation: AROM;10 reps Internal Rotation: AROM;10 reps Flexion: AROM;10 reps Abduction: AROM;10 reps (8 of 10 repetitions completed ) Therapy Ball Flexion: 20 reps ABduction: 20 reps Right/Left: 5 reps ROM / Strengthening / Isometric Strengthening UBE (Upper Arm Bike): 2' forward and 2' reverse at 1.0 Wall Wash: 1' X to V Arms: 5X Proximal Shoulder Strengthening, Supine: 10X      Manual Therapy Manual Therapy: Myofascial release Myofascial Release: MFR and manual stretching to right shoulder, upper arm, scapular, and trapezius region and associated areas to decrease pain and restrictions and improve pain free mobility in right shoulder region  Occupational Therapy Assessment and Plan OT Assessment and Plan Clinical Impression Statement: A:  Transitioned to AROM in supine and seated with min vg to maintain depressed shoulder blade with seated exercises.  Unable to complete 10 repetitions of AROM abduction in  seated.  OT Plan: P:  Complete 10 repetitions of Abd in seated and 10 x to v arms with less pain.    Goals Short Term Goals Time to Complete Short Term Goals: 3 weeks Short Term Goal 1: Patient will be educated on a HEP. Short Term Goal 2: Patient will improve AROM by 10 degrees in right shoulder for increased ability to reach into overhead shelves. Short Term Goal 3: Patient will improve right shoulder strength to 4+/5 for increased ability to lift bags of groceries. Short Term Goal 4: Patient will decrease pain to 2/10 or less in his right shoulder when completing daily activities.  Short Term Goal 5: Patient will decrease fascial restrictions to minimal in his right shoulder.  Long Term Goals Time to Complete Long Term Goals: 6 weeks Long Term Goal 1: Pateint will return to prior level of independence with all daily activities, leisure activities.  Long Term Goal 2: Patient will have WNL AROM in right shoulder in order to reach overhead without difficulty. Long Term Goal 3: Patient will have 5/5 strength in his right shoulder for increased independence with lifting yard tools.  Long Term Goal 4: Patient will have 0 pain in his right shoulder during functional activities.  Long Term Goal 5: Patient will have trace fascial restrictions in his right shoulder region.   Problem List Patient Active Problem List   Diagnosis Date Noted  . Rotator cuff syndrome of right shoulder 01/14/2013  . Partial tear of right rotator cuff 01/06/2013  . Acute pain of right shoulder 01/06/2013  . KNEE, ARTHRITIS, DEGEN./OSTEO 06/02/2009  . DERANGEMENT MENISCUS 06/02/2009  . KNEE PAIN 06/02/2009    End of Session Activity Tolerance: Patient tolerated treatment well General Behavior During Therapy: [facility] for tasks assessed/performed  GO    Shirlean Mylar, OTR/L  01/17/2013,  9:38 AM

## 2013-01-20 ENCOUNTER — Ambulatory Visit (HOSPITAL_COMMUNITY)
Admission: RE | Admit: 2013-01-20 | Discharge: 2013-01-20 | Disposition: A | Discharge status: Home / Self Care | Payer: Medicare Other | Source: Ambulatory Visit | Attending: Orthopedic Surgery | Admitting: Orthopedic Surgery

## 2013-01-20 DIAGNOSIS — M25511 Pain in right shoulder: Secondary | ICD-10-CM

## 2013-01-20 DIAGNOSIS — M75111 Incomplete rotator cuff tear or rupture of right shoulder, not specified as traumatic: Secondary | ICD-10-CM

## 2013-01-20 NOTE — Progress Notes (Signed)
Occupational Therapy Treatment Patient Details  Name: SAHAS SLUKA MRN: [id number] Date of Birth: 07/13/1945  Today's Date: 01/20/2013 Time: 1478-2956 OT Time Calculation (min): 45 min MFR 850-901 11' Therex 213-086 34'  Visit#: 5 of 12  Re-eval: 02/03/13    Authorization: Medicare  Authorization Time Period: before 10th visit  Authorization Visit#: 5 of 10  Subjective Symptoms/Limitations Symptoms: S: My shoulder only hurts when I move it the wrong way. Pain Assessment Currently in Pain?: No/denies  Precautions/Restrictions  Precautions Precautions: None  Exercise/Treatments Supine Protraction: PROM;10 reps;AROM;15 reps Horizontal ABduction: PROM;10 reps;AROM;15 reps External Rotation: PROM;10 reps;AROM;15 reps Internal Rotation: PROM;10 reps;AROM;15 reps Flexion: PROM;10 reps;AROM;15 reps ABduction: PROM;10 reps;AROM;15 reps Seated Elevation: AROM;15 reps Extension: AROM;15 reps Row: AROM;15 reps Protraction: AROM;10 reps Horizontal ABduction: AROM;10 reps External Rotation: AROM;10 reps Internal Rotation: AROM;10 reps Flexion: AROM;10 reps Abduction: AROM;10 reps ROM / Strengthening / Isometric Strengthening UBE (Upper Arm Bike): 2' forward and 2' reverse at 1.0 Wall Wash: 1' X to V Arms: 10X Proximal Shoulder Strengthening, Supine: 10X       Manual Therapy Manual Therapy: Myofascial release Myofascial Release: MFR and manual stretching to right shoulder, upper arm, scapular, and trapezius region and associated areas to decrease pain and restrictions and improve pain free mobility in right shoulder region  Occupational Therapy Assessment and Plan OT Assessment and Plan Clinical Impression Statement: A: Patient able to complete 15 reps of supine AROM exercises, 10 reps of seated abduction, and 10 reps of X to V arms. patient has some difficulty during X to V arms due to fatigue and completed without rest breaks.  OT Plan: P: Add W arms and proximal  shoulder strengthening seated.    Goals Short Term Goals Time to Complete Short Term Goals: 3 weeks Short Term Goal 1: Patient will be educated on a HEP. Short Term Goal 1 Progress: Progressing toward goal Short Term Goal 2: Patient will improve AROM by 10 degrees in right shoulder for increased ability to reach into overhead shelves. Short Term Goal 2 Progress: Progressing toward goal Short Term Goal 3: Patient will improve right shoulder strength to 4+/5 for increased ability to lift bags of groceries. Short Term Goal 3 Progress: Progressing toward goal Short Term Goal 4: Patient will decrease pain to 2/10 or less in his right shoulder when completing daily activities.  Short Term Goal 4 Progress: Progressing toward goal Short Term Goal 5: Patient will decrease fascial restrictions to minimal in his right shoulder.  Short Term Goal 5 Progress: Progressing toward goal Long Term Goals Time to Complete Long Term Goals: 6 weeks Long Term Goal 1: Pateint will return to prior level of independence with all daily activities, leisure activities.  Long Term Goal 1 Progress: Progressing toward goal Long Term Goal 2: Patient will have WNL AROM in right shoulder in order to reach overhead without difficulty. Long Term Goal 2 Progress: Progressing toward goal Long Term Goal 3: Patient will have 5/5 strength in his right shoulder for increased independence with lifting yard tools.  Long Term Goal 3 Progress: Progressing toward goal Long Term Goal 4: Patient will have 0 pain in his right shoulder during functional activities.  Long Term Goal 4 Progress: Progressing toward goal Long Term Goal 5: Patient will have trace fascial restrictions in his right shoulder region.  Long Term Goal 5 Progress: Progressing toward goal  Problem List Patient Active Problem List   Diagnosis Date Noted  . Rotator cuff syndrome of right shoulder 01/14/2013  .  Partial tear of right rotator cuff 01/06/2013  . Acute  pain of right shoulder 01/06/2013  . KNEE, ARTHRITIS, DEGEN./OSTEO 06/02/2009  . DERANGEMENT MENISCUS 06/02/2009  . KNEE PAIN 06/02/2009    End of Session Activity Tolerance: Patient tolerated treatment well General Behavior During Therapy: [facility] for tasks assessed/performed   Limmie Patricia, OTR/L,CBIS   01/20/2013, 9:32 AM

## 2013-01-24 ENCOUNTER — Ambulatory Visit (HOSPITAL_COMMUNITY)
Admission: RE | Admit: 2013-01-24 | Discharge: 2013-01-24 | Disposition: A | Discharge status: Home / Self Care | Payer: Medicare Other | Source: Ambulatory Visit | Attending: Family Medicine | Admitting: Family Medicine

## 2013-01-24 DIAGNOSIS — M75111 Incomplete rotator cuff tear or rupture of right shoulder, not specified as traumatic: Secondary | ICD-10-CM

## 2013-01-24 DIAGNOSIS — M25519 Pain in unspecified shoulder: Secondary | ICD-10-CM | POA: Insufficient documentation

## 2013-01-24 DIAGNOSIS — M25619 Stiffness of unspecified shoulder, not elsewhere classified: Secondary | ICD-10-CM | POA: Insufficient documentation

## 2013-01-24 DIAGNOSIS — IMO0001 Reserved for inherently not codable concepts without codable children: Secondary | ICD-10-CM | POA: Insufficient documentation

## 2013-01-24 DIAGNOSIS — M25511 Pain in right shoulder: Secondary | ICD-10-CM

## 2013-01-24 NOTE — Progress Notes (Signed)
Occupational Therapy Treatment Patient Details  Name: Timothy Schroeder MRN: [id number] Date of Birth: 25-Oct-1945  Today's Date: 01/24/2013 Time: 4098-1191 OT Time Calculation (min): 38 min Manual therapy 855-906 11' Therapeutic exercises [phone] 27' Visit#: 6 of 12  Re-eval: 02/03/13    Authorization: Medicare  Authorization Time Period: before 10th visit  Authorization Visit#: 6 of 10  Subjective S:  Its feeling pretty good.   Pain Assessment Currently in Pain?: No/denies Pain Score: 0-No pain  Precautions/Restrictions   progress as tolerated  Exercise/Treatments Supine Protraction: PROM;Strengthening;10 reps Protraction Weight (lbs): 1 Horizontal ABduction: PROM;Strengthening;10 reps Horizontal ABduction Weight (lbs): 1 External Rotation: PROM;Strengthening;10 reps External Rotation Weight (lbs): 1 Internal Rotation: PROM;Strengthening;10 reps Internal Rotation Weight (lbs): 1 Flexion: PROM;Strengthening;10 reps Shoulder Flexion Weight (lbs): 1 ABduction: PROM;Strengthening;10 reps Shoulder ABduction Weight (lbs): 1 Seated Protraction: AROM;15 reps Horizontal ABduction: AROM;15 reps External Rotation: AROM;15 reps Internal Rotation: AROM;15 reps Flexion: AROM;15 reps Abduction: AROM;15 reps Therapy Ball Right/Left: 5 reps ROM / Strengthening / Isometric Strengthening UBE (Upper Arm Bike): 3' forwad and 3' reverse 1.5 intensity  Wall Wash: 2' "W" Arms: 5 X 2 sets  X to V Arms: 15 X  Proximal Shoulder Strengthening, Supine: 10X with 1# resistance      Manual Therapy Manual Therapy: Myofascial release Myofascial Release: MFR and manual stretching to right shoulder, upper arm, scapular, and trapezius region and associated areas to decrease pain and restrictions and improve pain free mobility in right shoulder region  Occupational Therapy Assessment and Plan OT Assessment and Plan Clinical Impression Statement: A:  Added 1# resistance to supine exercises.   Patient demonstrated good form.  Note audible clunking with flexion and abduction in supine.  OT Plan: P:  Attempt strengthening in supine.  Add time to wall wash for greater shoulder stabilty.  Add tband exercises in standing for great scapular stability.    Goals Short Term Goals Time to Complete Short Term Goals: 3 weeks Short Term Goal 1: Patient will be educated on a HEP. Short Term Goal 2: Patient will improve AROM by 10 degrees in right shoulder for increased ability to reach into overhead shelves. Short Term Goal 3: Patient will improve right shoulder strength to 4+/5 for increased ability to lift bags of groceries. Short Term Goal 4: Patient will decrease pain to 2/10 or less in his right shoulder when completing daily activities.  Short Term Goal 5: Patient will decrease fascial restrictions to minimal in his right shoulder.  Long Term Goals Time to Complete Long Term Goals: 6 weeks Long Term Goal 1: Pateint will return to prior level of independence with all daily activities, leisure activities.  Long Term Goal 2: Patient will have WNL AROM in right shoulder in order to reach overhead without difficulty. Long Term Goal 3: Patient will have 5/5 strength in his right shoulder for increased independence with lifting yard tools.  Long Term Goal 4: Patient will have 0 pain in his right shoulder during functional activities.  Long Term Goal 5: Patient will have trace fascial restrictions in his right shoulder region.   Problem List Patient Active Problem List   Diagnosis Date Noted  . Rotator cuff syndrome of right shoulder 01/14/2013  . Partial tear of right rotator cuff 01/06/2013  . Acute pain of right shoulder 01/06/2013  . KNEE, ARTHRITIS, DEGEN./OSTEO 06/02/2009  . DERANGEMENT MENISCUS 06/02/2009  . KNEE PAIN 06/02/2009    End of Session Activity Tolerance: Patient tolerated treatment well General Behavior During Therapy: [facility] for  tasks assessed/performed  GO     Shirlean Mylar, OTR/L  01/24/2013, 9:30 AM

## 2013-01-27 ENCOUNTER — Ambulatory Visit (HOSPITAL_COMMUNITY)
Admission: RE | Admit: 2013-01-27 | Discharge: 2013-01-27 | Disposition: A | Discharge status: Home / Self Care | Payer: Medicare Other | Source: Ambulatory Visit | Attending: Orthopedic Surgery | Admitting: Orthopedic Surgery

## 2013-01-27 DIAGNOSIS — M75111 Incomplete rotator cuff tear or rupture of right shoulder, not specified as traumatic: Secondary | ICD-10-CM

## 2013-01-27 DIAGNOSIS — M25511 Pain in right shoulder: Secondary | ICD-10-CM

## 2013-01-27 NOTE — Progress Notes (Signed)
Occupational Therapy Treatment Patient Details  Name: LANDON BASSFORD MRN: [id number] Date of Birth: 10/04/1945  Today's Date: 01/27/2013 Time: 9528-4132 OT Time Calculation (min): 46 min Manual therapy 4401-0272 18' 1123-1151 28' Visit#: 7 of 8  Re-eval: 02/03/13    Authorization: Medicare  Authorization Time Period: before 10th visit  Authorization Visit#: 7 of 10  Subjective S:  I feel pretty good today.   Pain Assessment Currently in Pain?: No/denies Pain Score: 0-No pain  Precautions/Restrictions   progress as tolerated  Exercise/Treatments Supine Protraction: PROM;Strengthening;10 reps Protraction Weight (lbs): 2 Horizontal ABduction: PROM;Strengthening;10 reps Horizontal ABduction Weight (lbs): 2 External Rotation: PROM;Strengthening;10 reps External Rotation Weight (lbs): 2 Internal Rotation: PROM;Strengthening;10 reps Internal Rotation Weight (lbs): 2 Flexion: PROM;Strengthening;10 reps Shoulder Flexion Weight (lbs): 2 ABduction: PROM;Strengthening;10 reps Shoulder ABduction Weight (lbs): 2 Seated Protraction: AROM;15 reps Horizontal ABduction: AROM;15 reps External Rotation: AROM;15 reps Internal Rotation: AROM;15 reps Flexion: AROM;15 reps Abduction: AROM;15 reps Standing External Rotation: Theraband;15 reps Theraband Level (Shoulder External Rotation): Level 2 (Red) Internal Rotation: Theraband;15 reps Theraband Level (Shoulder Internal Rotation): Level 2 (Red) Extension: Theraband;15 reps Theraband Level (Shoulder Extension): Level 2 (Red) Row: Theraband;15 reps Theraband Level (Shoulder Row): Level 2 (Red) Retraction: Theraband;15 reps Theraband Level (Shoulder Retraction): Level 2 (Red) Therapy Ball Right/Left: 5 reps ROM / Strengthening / Isometric Strengthening UBE (Upper Arm Bike): 3' forwad and 3' reverse 1.5 intensity  Wall Wash: 3' "W" Arms: resume next visit X to V Arms: resume next visit Proximal Shoulder Strengthening, Supine: 10X  with 1# resistance Proximal Shoulder Strengthening, Seated: 10X      Manual Therapy Manual Therapy: Myofascial release Myofascial Release: MFR and manual stretching to right shoulder, upper arm, scapular, and trapezius region and associated areas to decrease pain and restrictions and improve pain free mobility in right shoulder region  Occupational Therapy Assessment and Plan OT Assessment and Plan Clinical Impression Statement: A:  Increased to 2# resistance with supine exercises.  Arm fatigues with abduction in seated.  OT Plan: P:  Resume x to v and w arms, attempt 1# in seated.    Goals Short Term Goals Time to Complete Short Term Goals: 3 weeks Short Term Goal 1: Patient will be educated on a HEP. Short Term Goal 1 Progress: Progressing toward goal Short Term Goal 2: Patient will improve AROM by 10 degrees in right shoulder for increased ability to reach into overhead shelves. Short Term Goal 2 Progress: Progressing toward goal Short Term Goal 3: Patient will improve right shoulder strength to 4+/5 for increased ability to lift bags of groceries. Short Term Goal 3 Progress: Progressing toward goal Short Term Goal 4: Patient will decrease pain to 2/10 or less in his right shoulder when completing daily activities.  Short Term Goal 4 Progress: Progressing toward goal Short Term Goal 5: Patient will decrease fascial restrictions to minimal in his right shoulder.  Short Term Goal 5 Progress: Progressing toward goal Long Term Goals Time to Complete Long Term Goals: 6 weeks Long Term Goal 1: Pateint will return to prior level of independence with all daily activities, leisure activities.  Long Term Goal 1 Progress: Progressing toward goal Long Term Goal 2: Patient will have WNL AROM in right shoulder in order to reach overhead without difficulty. Long Term Goal 2 Progress: Progressing toward goal Long Term Goal 3: Patient will have 5/5 strength in his right shoulder for increased  independence with lifting yard tools.  Long Term Goal 3 Progress: Progressing toward goal Long Term Goal  4: Patient will have 0 pain in his right shoulder during functional activities.  Long Term Goal 4 Progress: Progressing toward goal Long Term Goal 5: Patient will have trace fascial restrictions in his right shoulder region.  Long Term Goal 5 Progress: Progressing toward goal  Problem List Patient Active Problem List   Diagnosis Date Noted  . Rotator cuff syndrome of right shoulder 01/14/2013  . Partial tear of right rotator cuff 01/06/2013  . Acute pain of right shoulder 01/06/2013  . KNEE, ARTHRITIS, DEGEN./OSTEO 06/02/2009  . DERANGEMENT MENISCUS 06/02/2009  . KNEE PAIN 06/02/2009    End of Session Activity Tolerance: Patient tolerated treatment well General Behavior During Therapy: [facility] for tasks assessed/performed  GO    Shirlean Mylar, OTR/L  01/27/2013, 11:52 AM

## 2013-01-31 ENCOUNTER — Ambulatory Visit (HOSPITAL_COMMUNITY)
Admission: RE | Admit: 2013-01-31 | Discharge: 2013-01-31 | Disposition: A | Discharge status: Home / Self Care | Payer: Medicare Other | Source: Ambulatory Visit | Attending: Orthopedic Surgery | Admitting: Orthopedic Surgery

## 2013-01-31 DIAGNOSIS — M75111 Incomplete rotator cuff tear or rupture of right shoulder, not specified as traumatic: Secondary | ICD-10-CM

## 2013-01-31 DIAGNOSIS — M25511 Pain in right shoulder: Secondary | ICD-10-CM

## 2013-01-31 NOTE — Progress Notes (Signed)
Occupational Therapy Treatment Patient Details  Name: Timothy Schroeder MRN: [id number] Date of Birth: [birth year]  Today's Date: 01/31/2013 Time: 4098-1191 OT Time Calculation (min): 45 min Manual therapy [phone] 16' Therapeutic exercises 905-934 29' Visit#: 8 of 8  Re-eval: 02/03/13    Authorization: Medicare  Authorization Time Period: before 10th visit  Authorization Visit#: 8 of 10  Subjective Symptoms/Limitations Symptoms: S:  I was cutting limbs out of a tree yesterday and it felt ok. Pain Assessment Currently in Pain?: No/denies Pain Score: 0-No pain  Precautions/Restrictions     Exercise/Treatments Supine Protraction: PROM;10 reps;Strengthening;12 reps Protraction Weight (lbs): 2 Horizontal ABduction: PROM;10 reps;Strengthening;12 reps Horizontal ABduction Weight (lbs): 2 External Rotation: PROM;10 reps;Strengthening;12 reps External Rotation Weight (lbs): 2 Internal Rotation: PROM;10 reps;Strengthening;12 reps Internal Rotation Weight (lbs): 2 Flexion: PROM;10 reps;Strengthening;12 reps Shoulder Flexion Weight (lbs): 2 ABduction: PROM;10 reps;Strengthening;12 reps Shoulder ABduction Weight (lbs): 2 Seated Protraction: Strengthening;12 reps Protraction Weight (lbs): 2 Horizontal ABduction: Strengthening;10 reps Horizontal ABduction Weight (lbs): 1 External Rotation: Strengthening;10 reps External Rotation Weight (lbs): 1 Internal Rotation: Strengthening;10 reps Internal Rotation Weight (lbs): 1 Flexion: Strengthening;10 reps Flexion Weight (lbs): 1 Abduction: Strengthening;10 reps ABduction Weight (lbs): 1 ROM / Strengthening / Isometric Strengthening UBE (Upper Arm Bike): 3' forwad and 3' reverse 1.5 intensity  "W" Arms: 10 X with 1# X to V Arms: 10X with 1# Proximal Shoulder Strengthening, Seated: 10X with 1# Ball on Wall: 1' with arm flexed to 90 and 1 minute with arm abducted to 90      Manual Therapy Manual Therapy: Myofascial release Myofascial  Release: MFR and manual stretching to right shoulder, upper arm, scapular, and trapezius region and associated areas to decrease pain and restrictions and improve pain free mobility in right shoulder region  Occupational Therapy Assessment and Plan OT Assessment and Plan Clinical Impression Statement: A:  Added 1# resistance to exercises in seated, patient had most difficulty with horizontal abduction. OT Plan: P:  Reassess, add weight to wall wash.    Goals Short Term Goals Time to Complete Short Term Goals: 3 weeks Short Term Goal 1: Patient will be educated on a HEP. Short Term Goal 2: Patient will improve AROM by 10 degrees in right shoulder for increased ability to reach into overhead shelves. Short Term Goal 3: Patient will improve right shoulder strength to 4+/5 for increased ability to lift bags of groceries. Short Term Goal 4: Patient will decrease pain to 2/10 or less in his right shoulder when completing daily activities.  Short Term Goal 5: Patient will decrease fascial restrictions to minimal in his right shoulder.  Long Term Goals Time to Complete Long Term Goals: 6 weeks Long Term Goal 1: Pateint will return to prior level of independence with all daily activities, leisure activities.  Long Term Goal 2: Patient will have WNL AROM in right shoulder in order to reach overhead without difficulty. Long Term Goal 3: Patient will have 5/5 strength in his right shoulder for increased independence with lifting yard tools.  Long Term Goal 4: Patient will have 0 pain in his right shoulder during functional activities.  Long Term Goal 5: Patient will have trace fascial restrictions in his right shoulder region.   Problem List Patient Active Problem List   Diagnosis Date Noted  . Rotator cuff syndrome of right shoulder 01/14/2013  . Partial tear of right rotator cuff 01/06/2013  . Acute pain of right shoulder 01/06/2013  . KNEE, ARTHRITIS, DEGEN./OSTEO 06/02/2009  . DERANGEMENT  MENISCUS 06/02/2009  .  KNEE PAIN 06/02/2009    End of Session Activity Tolerance: Patient tolerated treatment well General Behavior During Therapy: [facility] for tasks assessed/performed  GO    Shirlean Mylar, OTR/L  01/31/2013, 9:32 AM

## 2013-02-03 ENCOUNTER — Ambulatory Visit (HOSPITAL_COMMUNITY)
Admission: RE | Admit: 2013-02-03 | Discharge: 2013-02-03 | Disposition: A | Discharge status: Home / Self Care | Payer: Medicare Other | Source: Ambulatory Visit | Attending: Orthopedic Surgery | Admitting: Orthopedic Surgery

## 2013-02-03 DIAGNOSIS — M75111 Incomplete rotator cuff tear or rupture of right shoulder, not specified as traumatic: Secondary | ICD-10-CM

## 2013-02-03 DIAGNOSIS — M25511 Pain in right shoulder: Secondary | ICD-10-CM

## 2013-02-03 NOTE — Evaluation (Signed)
Occupational Therapy RE-Evaluation  Patient Details  Name: KAIDON KINKER MRN: [id number] Date of Birth: 1945-05-05  Today's Date: 02/03/2013 Time: 6213-0865 OT Time Calculation (min): 31 min Manual Therapy [phone]  ROM/MMT [phone] 20' Visit#: 9 of 9  Re-eval: 02/03/13  Assessment Diagnosis: Right Shoulder Partial Rotator Cuff Tear  Authorization: Medicare  Authorization Time Period: before 10th visit  Authorization Visit#: 9 of 10   Past Medical History:  Past Medical History  Diagnosis Date  . Hypertension   . Hypercholesteremia   . Kidney donor    Past Surgical History:  Past Surgical History  Procedure Laterality Date  . Nephrectomy      Left  . Back surgery      Subjective S: I used to have hold my right arm up with my left to shave, and now I dont - thats an improvement! Special Tests: FOTO score was 56 at initial eval and is currently 59. Pain Assessment Currently in Pain?: No/denies Pain Score: 0-No pain   Assessment  Additional Assessments RUE AROM (degrees) RUE Overall AROM Comments: assessed in seated, ER/IR with shoulder abducted (initial evaluation) Right Shoulder Flexion: 170 Degrees (155) Right Shoulder ABduction: 162 Degrees (145) Right Shoulder Internal Rotation: 80 Degrees (70) Right Shoulder External Rotation: 75 Degrees (70) RUE Strength RUE Overall Strength Comments: assessed in seated, ER/IR with shoulder abducted to 90 Right Shoulder Flexion: 5/5 (4-/5) Right Shoulder ABduction: 5/5 (4/5) Right Shoulder Internal Rotation: 5/5 (4/5) Right Shoulder External Rotation:  (4+/5 (4/5)) Palpation Palpation: minimal fascial restrictions      Exercise/Treatments  Manual Therapy Manual Therapy: Myofascial release Myofascial Release: MFR and manual stretching to right shoulder, upper arm, scapular, and trapezius region and associated areas to decrease pain and restrictions and improve pain free mobility in right shoulder region  Occupational  Therapy Assessment and Plan OT Assessment and Plan Clinical Impression Statement: A:  Patient has met all short term goals and 2/5 long term goals.  He is pleased with his current functional status, and wishes to be dc from skilled OT intervention at this time.  He is able to shave and comb his hair with his right arm with greater ease.  He has not attempted hammering and plans on doing so soon.  Patient was educated on a HEP for proximal shoulder strengthening with green theraband.   OT Plan: P:  DC from skilled OT services this date.    Goals Short Term Goals Time to Complete Short Term Goals: 3 weeks Short Term Goal 1: Patient will be educated on a HEP. Short Term Goal 1 Progress: Met Short Term Goal 2: Patient will improve AROM by 10 degrees in right shoulder for increased ability to reach into overhead shelves. Short Term Goal 2 Progress: Met Short Term Goal 3: Patient will improve right shoulder strength to 4+/5 for increased ability to lift bags of groceries. Short Term Goal 3 Progress: Met Short Term Goal 4: Patient will decrease pain to 2/10 or less in his right shoulder when completing daily activities.  Short Term Goal 4 Progress: Met Short Term Goal 5: Patient will decrease fascial restrictions to minimal in his right shoulder.  Short Term Goal 5 Progress: Met Long Term Goals Time to Complete Long Term Goals: 6 weeks Long Term Goal 1: Pateint will return to prior level of independence with all daily activities, leisure activities.  Long Term Goal 1 Progress: Met Long Term Goal 2: Patient will have WNL AROM in right shoulder in order to reach overhead  without difficulty. Long Term Goal 2 Progress: Progressing toward goal Long Term Goal 3: Patient will have 5/5 strength in his right shoulder for increased independence with lifting yard tools.  Long Term Goal 3 Progress: Met Long Term Goal 4: Patient will have 0 pain in his right shoulder during functional activities.  Long Term  Goal 4 Progress: Progressing toward goal Long Term Goal 5: Patient will have trace fascial restrictions in his right shoulder region.  Long Term Goal 5 Progress: Progressing toward goal  Problem List Patient Active Problem List   Diagnosis Date Noted  . Rotator cuff syndrome of right shoulder 01/14/2013  . Partial tear of right rotator cuff 01/06/2013  . Acute pain of right shoulder 01/06/2013  . KNEE, ARTHRITIS, DEGEN./OSTEO 06/02/2009  . DERANGEMENT MENISCUS 06/02/2009  . KNEE PAIN 06/02/2009    End of Session Activity Tolerance: Patient tolerated treatment well General Behavior During Therapy: WFL for tasks assessed/performed OT Plan of Care OT Home Exercise Plan: shoulder proximal strengthening with green tband.   GO Functional Assessment Tool Used: FOTO scored 59% I , patient reports 70% I  Functional Limitation: Carrying, moving and handling objects Carrying, Moving and Handling Objects Goal Status ([id number]): At least 1 percent but less than 20 percent impaired, limited or restricted Carrying, Moving and Handling Objects Discharge Status [phone]): At least 20 percent but less than 40 percent impaired, limited or restricted  Shirlean Mylar, OTR/L  02/03/2013, 9:29 AM  Physician Documentation Your signature is required to indicate approval of the treatment plan as stated above.  Please sign and either send electronically or make a copy of this report for your files and return this physician signed original.  Please mark one 1.__approve of plan  2. ___approve of plan with the following conditions.   ______________________________                                                          _____________________ Physician Signature                                                                                                             Date

## 2013-02-07 ENCOUNTER — Ambulatory Visit (HOSPITAL_COMMUNITY): Payer: Medicare Other | Admitting: Specialist

## 2013-02-17 ENCOUNTER — Ambulatory Visit (HOSPITAL_COMMUNITY): Payer: Medicare Other | Admitting: Specialist

## 2013-02-20 ENCOUNTER — Ambulatory Visit (HOSPITAL_COMMUNITY): Payer: Medicare Other | Admitting: Occupational Therapy

## 2013-02-27 ENCOUNTER — Other Ambulatory Visit: Payer: Self-pay

## 2013-12-03 ENCOUNTER — Other Ambulatory Visit (HOSPITAL_COMMUNITY): Payer: Self-pay | Admitting: Orthopaedic Surgery

## 2013-12-03 DIAGNOSIS — M25562 Pain in left knee: Secondary | ICD-10-CM

## 2013-12-08 ENCOUNTER — Ambulatory Visit (HOSPITAL_COMMUNITY)
Admission: RE | Admit: 2013-12-08 | Discharge: 2013-12-08 | Disposition: A | Discharge status: Home / Self Care | Payer: Medicare Other | Source: Ambulatory Visit | Attending: Orthopaedic Surgery | Admitting: Orthopaedic Surgery

## 2013-12-08 DIAGNOSIS — M25562 Pain in left knee: Secondary | ICD-10-CM

## 2013-12-08 DIAGNOSIS — M25569 Pain in unspecified knee: Secondary | ICD-10-CM | POA: Insufficient documentation

## 2013-12-08 DIAGNOSIS — M23302 Other meniscus derangements, unspecified lateral meniscus, unspecified knee: Secondary | ICD-10-CM | POA: Insufficient documentation

## 2014-05-21 ENCOUNTER — Encounter (INDEPENDENT_AMBULATORY_CARE_PROVIDER_SITE_OTHER): Payer: Self-pay | Admitting: *Deleted

## 2014-05-21 ENCOUNTER — Encounter (INDEPENDENT_AMBULATORY_CARE_PROVIDER_SITE_OTHER): Payer: Self-pay

## 2014-05-27 ENCOUNTER — Telehealth (INDEPENDENT_AMBULATORY_CARE_PROVIDER_SITE_OTHER): Payer: Self-pay | Admitting: *Deleted

## 2014-05-27 ENCOUNTER — Other Ambulatory Visit (INDEPENDENT_AMBULATORY_CARE_PROVIDER_SITE_OTHER): Payer: Self-pay | Admitting: *Deleted

## 2014-05-27 DIAGNOSIS — Z1211 Encounter for screening for malignant neoplasm of colon: Secondary | ICD-10-CM

## 2014-05-27 DIAGNOSIS — Z86010 Personal history of colonic polyps: Secondary | ICD-10-CM

## 2014-05-27 NOTE — Telephone Encounter (Signed)
Patient needs movi prep 

## 2014-05-28 MED ORDER — PEG-KCL-NACL-NASULF-NA ASC-C 100 G PO SOLR: 1.0000 | Freq: Once | ORAL | Status: DC

## 2014-06-09 ENCOUNTER — Telehealth (INDEPENDENT_AMBULATORY_CARE_PROVIDER_SITE_OTHER): Payer: Self-pay | Admitting: *Deleted

## 2014-06-09 NOTE — Telephone Encounter (Signed)
Referring MD/PCP: tapper   Procedure: tcs  Reason/Indication:  Hx polyps  Has patient had this procedure before?  Yes, 2009 -- scanned  If so, when, by whom and where?    Is there a family history of colon cancer?  no  Who?  What age when diagnosed?    Is patient diabetic?   no      Does patient have prosthetic heart valve?  no  Do you have a pacemaker?  no  Has patient ever had endocarditis? no  Has patient had joint replacement within last 12 months?  no  Does patient tend to be constipated or take laxatives? no  Is patient on Coumadin, Plavix and/or Aspirin? yes  Medications: see epic  Allergies: nkda  Medication Adjustment: asa 2 days  Procedure date & time: 07/08/14 at 1030

## 2014-06-10 NOTE — Telephone Encounter (Signed)
agree

## 2014-06-12 ENCOUNTER — Encounter (INDEPENDENT_AMBULATORY_CARE_PROVIDER_SITE_OTHER): Payer: Self-pay | Admitting: *Deleted

## 2014-07-08 ENCOUNTER — Ambulatory Visit (HOSPITAL_COMMUNITY)
Admission: RE | Admit: 2014-07-08 | Discharge: 2014-07-08 | Disposition: A | Discharge status: Home / Self Care | Payer: Medicare Other | Source: Ambulatory Visit | Attending: Internal Medicine | Admitting: Internal Medicine

## 2014-07-08 ENCOUNTER — Encounter (HOSPITAL_COMMUNITY): Payer: Self-pay

## 2014-07-08 ENCOUNTER — Encounter (HOSPITAL_COMMUNITY)
Admission: RE | Disposition: A | Discharge status: Home / Self Care | Payer: Self-pay | Source: Ambulatory Visit | Attending: Internal Medicine

## 2014-07-08 DIAGNOSIS — Z86010 Personal history of colonic polyps: Secondary | ICD-10-CM | POA: Insufficient documentation

## 2014-07-08 DIAGNOSIS — Z905 Acquired absence of kidney: Secondary | ICD-10-CM | POA: Diagnosis not present

## 2014-07-08 DIAGNOSIS — I10 Essential (primary) hypertension: Secondary | ICD-10-CM | POA: Insufficient documentation

## 2014-07-08 DIAGNOSIS — K648 Other hemorrhoids: Secondary | ICD-10-CM | POA: Diagnosis not present

## 2014-07-08 DIAGNOSIS — E780 Pure hypercholesterolemia: Secondary | ICD-10-CM | POA: Diagnosis not present

## 2014-07-08 DIAGNOSIS — Z7982 Long term (current) use of aspirin: Secondary | ICD-10-CM | POA: Insufficient documentation

## 2014-07-08 DIAGNOSIS — K644 Residual hemorrhoidal skin tags: Secondary | ICD-10-CM | POA: Insufficient documentation

## 2014-07-08 HISTORY — PX: COLONOSCOPY: SHX5424

## 2014-07-08 SURGERY — COLONOSCOPY
Anesthesia: Moderate Sedation

## 2014-07-08 MED ORDER — SODIUM CHLORIDE 0.9 % IV SOLN
INTRAVENOUS | Status: DC
  Administered 2014-07-08: 11:00:00 via INTRAVENOUS

## 2014-07-08 MED ORDER — MEPERIDINE HCL 50 MG/ML IJ SOLN
INTRAMUSCULAR | Status: DC | PRN
  Administered 2014-07-08 (×2): 25 mg via INTRAVENOUS

## 2014-07-08 MED ORDER — MIDAZOLAM HCL 5 MG/5ML IJ SOLN
INTRAMUSCULAR | Status: AC
  Filled 2014-07-08: qty 10

## 2014-07-08 MED ORDER — MEPERIDINE HCL 50 MG/ML IJ SOLN
INTRAMUSCULAR | Status: AC
  Filled 2014-07-08: qty 1

## 2014-07-08 MED ORDER — MIDAZOLAM HCL 5 MG/5ML IJ SOLN
INTRAMUSCULAR | Status: DC | PRN
  Administered 2014-07-08 (×4): 2 mg via INTRAVENOUS

## 2014-07-08 MED ORDER — SIMETHICONE 40 MG/0.6ML PO SUSP
ORAL | Status: DC | PRN
  Administered 2014-07-08: 12:00:00

## 2014-07-08 NOTE — Op Note (Signed)
COLONOSCOPY PROCEDURE REPORT  PATIENT:  Timothy Schroeder  MR#:  [id number] Birthdate:  [birth year], 69 y.o., male Endoscopist:  Dr. [name], MD Referred By:  Dr. Matthias Hughs, MD  Procedure Date: 07/08/2014  Procedure:   Colonoscopy  Indications: Patient is 69 year old Caucasian male who had 8 mm adenoma removed from his colon six years ago and is here for surveillance colonoscopy. Family history is negative for CRC.  Informed Consent:  The procedure and risks were reviewed with the patient and informed consent was obtained.  Medications:  Demerol 50 mg IV Versed 8 mg IV  Description of procedure:  After a digital rectal exam was performed, that colonoscope was advanced from the anus through the rectum and colon to the area of the cecum, ileocecal valve and appendiceal orifice. The cecum was deeply intubated. These structures were well-seen and photographed for the record. From the level of the cecum and ileocecal valve, the scope was slowly and cautiously withdrawn. The mucosal surfaces were carefully surveyed utilizing scope tip to flexion to facilitate fold flattening as needed. The scope was pulled down into the rectum where a thorough exam including retroflexion was performed.  Findings: . Prep satisfactory. Normal mucosa of cecum, ascending colon, hepatic flexure, transverse colon, splenic flexure, descending and sigmoid colon. Normal rectal mucosa.  small hemorrhoids below the dentate line.   Therapeutic/Diagnostic Maneuvers Performed: None  Complications: None  Cecal Withdrawal Time:  11 minutes  Impression:  Examination performed to cecum. Small external hemorrhoids. No evidence of recurrent polyps.  Recommendations:  Standard instructions given. Next colonoscopy in 7 years.  Timothy Schroeder,Timothy Schroeder  07/08/2014 12:10 PM  CC: Dr. Deloria Lair, MD & Dr. Rayne Du ref. provider found

## 2014-07-08 NOTE — H&P (Signed)
Timothy Schroeder is an 69 y.o. male.   Chief Complaint: Patient is here for colonoscopy. HPI: Patient is 69 year old Caucasian male was here for surveillance colonoscopy. His last exam was in February 2009 with removal of single polyp which is tubular adenoma. He denies abdominal pain change in bowel habits or rectal bleeding. Family history is negative for CRC.  Past Medical History  Diagnosis Date  . Hypertension   . Hypercholesteremia   . Kidney donor     Past Surgical History  Procedure Laterality Date  . Nephrectomy      Left  . Back surgery      History reviewed. No pertinent family history. Social History:  reports that he has never smoked. He does not have any smokeless tobacco history on file. He reports that he drinks alcohol. His drug history is not on file.  Allergies: No Known Allergies  Medications Prior to Admission  Medication Sig Dispense Refill  . aspirin EC 81 MG tablet Take 81 mg by mouth daily.    [name] lisinopril (PRINIVIL,ZESTRIL) 20 MG tablet Take 20 mg by mouth daily.    . Multiple Vitamin (MULTIVITAMIN) tablet Take 1 tablet by mouth every other day.    . naproxen sodium (ALEVE) 220 MG tablet Take 440 mg by mouth daily.     . pravastatin (PRAVACHOL) 40 MG tablet Take 40 mg by mouth at bedtime.    . Saw Palmetto, Serenoa repens, 1000 MG CAPS Take 1,000 mg by mouth daily.    [name] doxycycline (VIBRAMYCIN) 100 MG capsule Take 1 capsule (100 mg total) by mouth 2 (two) times daily. (Patient not taking: Reported on 06/24/2014) 30 capsule 0  . naproxen (NAPROSYN) 500 MG tablet Take 1 tablet (500 mg total) by mouth 2 (two) times daily. (Patient not taking: Reported on 06/24/2014) 30 tablet 0  . peg 3350 powder (MOVIPREP) 100 G SOLR Take 1 kit (200 g total) by mouth once. 1 kit 0  . predniSONE (DELTASONE) 50 MG tablet Take 1 tablet (50 mg total) by mouth daily. (Patient not taking: Reported on 06/24/2014) 5 tablet 0    No results found for this or any previous visit (from the past  48 hour(s)). No results found.  ROS  Blood pressure 118/80, resp. rate 16. Physical Exam  Constitutional: He appears well-developed and well-nourished.  HENT:  Mouth/Throat: Oropharynx is clear and moist.  Eyes: Conjunctivae are normal. No scleral icterus.  Neck: No thyromegaly present.  Cardiovascular: Normal rate, regular rhythm and normal heart sounds.   No murmur heard. Respiratory: Effort normal and breath sounds normal.  GI: Soft. He exhibits no distension and no mass. There is no tenderness.  Small umbilical hernia  Musculoskeletal: He exhibits no edema.  Lymphadenopathy:    He has no cervical adenopathy.     Assessment/Plan History of colonic adenoma. Surveillance colonoscopy.  REHMAN,NAJEEB U 07/08/2014, 11:32 AM

## 2014-07-08 NOTE — Discharge Instructions (Signed)
Resume usual medications and diet. No driving for 24 hours. Next colonoscopy in 7 years.  Colonoscopy, Care After These instructions give you information on caring for yourself after your procedure. Your doctor may also give you more specific instructions. Call your doctor if you have any problems or questions after your procedure. HOME CARE  Do not drive for 24 hours.  Do not sign important papers or use machinery for 24 hours.  You may shower.  You may go back to your usual activities, but go slower for the first 24 hours.  Take rest breaks often during the first 24 hours.  Walk around or use warm packs on your belly (abdomen) if you have belly cramping or gas.  Drink enough fluids to keep your pee (urine) clear or pale yellow.  Resume your normal diet. Avoid heavy or fried foods.  Avoid drinking alcohol for 24 hours or as told by your doctor.  Only take medicines as told by your doctor. If a tissue sample (biopsy) was taken during the procedure:   Do not take aspirin or blood thinners for 7 days, or as told by your doctor.  Do not drink alcohol for 7 days, or as told by your doctor.  Eat soft foods for the first 24 hours. GET HELP IF: You still have a small amount of blood in your poop (stool) 2-3 days after the procedure. GET HELP RIGHT AWAY IF:  You have more than a small amount of blood in your poop.  You see clumps of tissue (blood clots) in your poop.  Your belly is puffy (swollen).  You feel sick to your stomach (nauseous) or throw up (vomit).  You have a fever.  You have belly pain that gets worse and medicine does not help. MAKE SURE YOU:  Understand these instructions.  Will watch your condition.  Will get help right away if you are not doing well or get worse. Document Released: 05/13/2010 Document Revised: 04/15/2013 Document Reviewed: 12/16/2012 [facility] Patient Information 2015 [place], [place]. This information is not intended to replace advice  given to you by your health care provider. Make sure you discuss any questions you have with your health care provider.

## 2014-07-09 ENCOUNTER — Encounter (HOSPITAL_COMMUNITY): Payer: Self-pay | Admitting: Internal Medicine

## 2015-09-10 DIAGNOSIS — Z23 Encounter for immunization: Secondary | ICD-10-CM | POA: Diagnosis not present

## 2015-09-10 DIAGNOSIS — Z0000 Encounter for general adult medical examination without abnormal findings: Secondary | ICD-10-CM | POA: Diagnosis not present

## 2015-09-10 DIAGNOSIS — G4733 Obstructive sleep apnea (adult) (pediatric): Secondary | ICD-10-CM | POA: Diagnosis not present

## 2015-09-10 DIAGNOSIS — E7800 Pure hypercholesterolemia, unspecified: Secondary | ICD-10-CM | POA: Diagnosis not present

## 2015-09-10 DIAGNOSIS — I10 Essential (primary) hypertension: Secondary | ICD-10-CM | POA: Diagnosis not present

## 2016-01-24 DIAGNOSIS — Z23 Encounter for immunization: Secondary | ICD-10-CM | POA: Diagnosis not present

## 2016-09-11 DIAGNOSIS — Z6831 Body mass index (BMI) 31.0-31.9, adult: Secondary | ICD-10-CM | POA: Diagnosis not present

## 2016-09-11 DIAGNOSIS — Z0000 Encounter for general adult medical examination without abnormal findings: Secondary | ICD-10-CM | POA: Diagnosis not present

## 2016-09-11 DIAGNOSIS — E7800 Pure hypercholesterolemia, unspecified: Secondary | ICD-10-CM | POA: Diagnosis not present

## 2016-09-11 DIAGNOSIS — I10 Essential (primary) hypertension: Secondary | ICD-10-CM | POA: Diagnosis not present

## 2016-09-11 DIAGNOSIS — G4733 Obstructive sleep apnea (adult) (pediatric): Secondary | ICD-10-CM | POA: Diagnosis not present

## 2016-09-11 DIAGNOSIS — N5201 Erectile dysfunction due to arterial insufficiency: Secondary | ICD-10-CM | POA: Diagnosis not present

## 2017-02-07 DIAGNOSIS — Z23 Encounter for immunization: Secondary | ICD-10-CM | POA: Diagnosis not present

## 2017-05-01 DIAGNOSIS — Z524 Kidney donor: Secondary | ICD-10-CM | POA: Diagnosis not present

## 2017-05-01 DIAGNOSIS — I10 Essential (primary) hypertension: Secondary | ICD-10-CM | POA: Diagnosis not present

## 2017-05-01 DIAGNOSIS — G4730 Sleep apnea, unspecified: Secondary | ICD-10-CM | POA: Diagnosis not present

## 2017-05-01 DIAGNOSIS — E782 Mixed hyperlipidemia: Secondary | ICD-10-CM | POA: Diagnosis not present

## 2017-05-01 DIAGNOSIS — Z6832 Body mass index (BMI) 32.0-32.9, adult: Secondary | ICD-10-CM | POA: Diagnosis not present

## 2017-05-01 DIAGNOSIS — M2550 Pain in unspecified joint: Secondary | ICD-10-CM | POA: Diagnosis not present

## 2017-05-01 DIAGNOSIS — G9009 Other idiopathic peripheral autonomic neuropathy: Secondary | ICD-10-CM | POA: Diagnosis not present

## 2017-05-21 DIAGNOSIS — M2550 Pain in unspecified joint: Secondary | ICD-10-CM | POA: Diagnosis not present

## 2017-05-21 DIAGNOSIS — Z125 Encounter for screening for malignant neoplasm of prostate: Secondary | ICD-10-CM | POA: Diagnosis not present

## 2017-05-21 DIAGNOSIS — I10 Essential (primary) hypertension: Secondary | ICD-10-CM | POA: Diagnosis not present

## 2017-05-21 DIAGNOSIS — Z79899 Other long term (current) drug therapy: Secondary | ICD-10-CM | POA: Diagnosis not present

## 2017-05-21 DIAGNOSIS — G4730 Sleep apnea, unspecified: Secondary | ICD-10-CM | POA: Diagnosis not present

## 2017-05-21 DIAGNOSIS — E782 Mixed hyperlipidemia: Secondary | ICD-10-CM | POA: Diagnosis not present

## 2017-05-21 DIAGNOSIS — G9009 Other idiopathic peripheral autonomic neuropathy: Secondary | ICD-10-CM | POA: Diagnosis not present

## 2017-05-21 DIAGNOSIS — Z524 Kidney donor: Secondary | ICD-10-CM | POA: Diagnosis not present

## 2017-05-24 DIAGNOSIS — I10 Essential (primary) hypertension: Secondary | ICD-10-CM | POA: Diagnosis not present

## 2017-05-24 DIAGNOSIS — M2550 Pain in unspecified joint: Secondary | ICD-10-CM | POA: Diagnosis not present

## 2017-05-24 DIAGNOSIS — R944 Abnormal results of kidney function studies: Secondary | ICD-10-CM | POA: Diagnosis not present

## 2017-05-24 DIAGNOSIS — G4730 Sleep apnea, unspecified: Secondary | ICD-10-CM | POA: Diagnosis not present

## 2017-05-24 DIAGNOSIS — Z524 Kidney donor: Secondary | ICD-10-CM | POA: Diagnosis not present

## 2017-05-24 DIAGNOSIS — G9009 Other idiopathic peripheral autonomic neuropathy: Secondary | ICD-10-CM | POA: Diagnosis not present

## 2017-05-24 DIAGNOSIS — E782 Mixed hyperlipidemia: Secondary | ICD-10-CM | POA: Diagnosis not present

## 2017-05-24 DIAGNOSIS — Z6832 Body mass index (BMI) 32.0-32.9, adult: Secondary | ICD-10-CM | POA: Diagnosis not present

## 2017-11-16 DIAGNOSIS — I10 Essential (primary) hypertension: Secondary | ICD-10-CM | POA: Diagnosis not present

## 2017-11-16 DIAGNOSIS — E782 Mixed hyperlipidemia: Secondary | ICD-10-CM | POA: Diagnosis not present

## 2017-11-16 DIAGNOSIS — R944 Abnormal results of kidney function studies: Secondary | ICD-10-CM | POA: Diagnosis not present

## 2017-11-16 DIAGNOSIS — G9009 Other idiopathic peripheral autonomic neuropathy: Secondary | ICD-10-CM | POA: Diagnosis not present

## 2017-11-16 DIAGNOSIS — Z524 Kidney donor: Secondary | ICD-10-CM | POA: Diagnosis not present

## 2017-11-16 DIAGNOSIS — Z6832 Body mass index (BMI) 32.0-32.9, adult: Secondary | ICD-10-CM | POA: Diagnosis not present

## 2017-11-16 DIAGNOSIS — M2550 Pain in unspecified joint: Secondary | ICD-10-CM | POA: Diagnosis not present

## 2017-11-16 DIAGNOSIS — G4730 Sleep apnea, unspecified: Secondary | ICD-10-CM | POA: Diagnosis not present

## 2017-11-27 DIAGNOSIS — E782 Mixed hyperlipidemia: Secondary | ICD-10-CM | POA: Diagnosis not present

## 2017-11-27 DIAGNOSIS — G9009 Other idiopathic peripheral autonomic neuropathy: Secondary | ICD-10-CM | POA: Diagnosis not present

## 2017-11-27 DIAGNOSIS — I10 Essential (primary) hypertension: Secondary | ICD-10-CM | POA: Diagnosis not present

## 2017-11-27 DIAGNOSIS — G4730 Sleep apnea, unspecified: Secondary | ICD-10-CM | POA: Diagnosis not present

## 2017-11-27 DIAGNOSIS — M2550 Pain in unspecified joint: Secondary | ICD-10-CM | POA: Diagnosis not present

## 2017-11-27 DIAGNOSIS — R944 Abnormal results of kidney function studies: Secondary | ICD-10-CM | POA: Diagnosis not present

## 2017-11-27 DIAGNOSIS — M65341 Trigger finger, right ring finger: Secondary | ICD-10-CM | POA: Diagnosis not present

## 2017-11-27 DIAGNOSIS — Z6831 Body mass index (BMI) 31.0-31.9, adult: Secondary | ICD-10-CM | POA: Diagnosis not present

## 2017-11-27 DIAGNOSIS — Z524 Kidney donor: Secondary | ICD-10-CM | POA: Diagnosis not present

## 2017-11-27 DIAGNOSIS — N400 Enlarged prostate without lower urinary tract symptoms: Secondary | ICD-10-CM | POA: Diagnosis not present

## 2018-01-29 DIAGNOSIS — Z23 Encounter for immunization: Secondary | ICD-10-CM | POA: Diagnosis not present

## 2018-01-30 ENCOUNTER — Encounter: Payer: Self-pay | Admitting: Neurology

## 2018-01-30 ENCOUNTER — Ambulatory Visit (INDEPENDENT_AMBULATORY_CARE_PROVIDER_SITE_OTHER): Payer: Medicare Other | Admitting: Neurology

## 2018-01-30 ENCOUNTER — Encounter

## 2018-01-30 VITALS — BP 129/83 | HR 67 | Ht 72.0 in | Wt 224.5 lb

## 2018-01-30 DIAGNOSIS — G629 Polyneuropathy, unspecified: Secondary | ICD-10-CM | POA: Insufficient documentation

## 2018-01-30 DIAGNOSIS — G6289 Other specified polyneuropathies: Secondary | ICD-10-CM | POA: Diagnosis not present

## 2018-01-30 NOTE — Progress Notes (Signed)
PATIENT: Timothy Schroeder DOB: 1945/11/28  Chief Complaint  Patient presents with  . Numbness    Reports gradual worsening of bilateral numbness in feet over the last 6-7 years.  His symptoms are now causing him difficulty when walking.  He has never taken any medications for this condition.  [name] PCP    [name], MD     [place] is a 72 years old male, seen in request by his primary care physician Dr. Nevada Crane, [name] for evaluation of numbness, initial evaluation was on January 30, 2018.  I have reviewed and summarized the referring note from the referring physician.  He had a past medical history of hypertension, hyperlipidemia, body mass index of 32, sleep apnea, using CPAP machine, kidney donor  He reported a history of gradual onset bilateral feet paresthesia since 2012, initially the numbness tingling involving plantar surface, gradually ascending to toes, now to ankle level, at the end of the day, after bearing weight for couple hours, he began to notice worsening paresthesia of bilateral feet, deep achy pain, he denies significant low back pain, denies upper extremity paresthesia, he denies bowel and bladder incontinence, but in recent years, he noticed mildly unsteady gait, difficulty keeping up his balance, tends to trip.  He reported recent laboratory evaluation from his primary care physician  REVIEW OF SYSTEMS: Full 14 system review of systems performed and notable only for as above All other review of systems were negative.  ALLERGIES: No Known Allergies  HOME MEDICATIONS: Current Outpatient Medications  Medication Sig Dispense Refill  . ACETAMINOPHEN PO Take 1,000 mg by mouth daily.    . Ascorbic Acid (VITAMIN C) 1000 MG tablet Take 1,000 mg by mouth daily.    [name] aspirin EC 81 MG tablet Take 81 mg by mouth daily.    . cholecalciferol (VITAMIN D) 1000 units tablet Take 1,000 Units by mouth daily.    [name] docusate sodium (COLACE) 100 MG capsule Take 200 mg by  mouth daily.    [name] lisinopril (PRINIVIL,ZESTRIL) 20 MG tablet Take 20 mg by mouth daily.    . pravastatin (PRAVACHOL) 40 MG tablet Take 40 mg by mouth at bedtime.    . TURMERIC PO Take 800 mg by mouth daily.     No current facility-administered medications for this visit.     PAST MEDICAL HISTORY: Past Medical History:  Diagnosis Date  . Hypercholesteremia   . Hypertension   . Kidney donor   . Numbness    feet    PAST SURGICAL HISTORY: Past Surgical History:  Procedure Laterality Date  . BACK SURGERY  1996  . COLONOSCOPY N/A 07/08/2014   Procedure: COLONOSCOPY;  Surgeon: [name], MD;  [facility];  Service: Endoscopy;  Laterality: N/A;  1030 - moved to 11:55 - Ann to notify pt  . NEPHRECTOMY  2003   Left    FAMILY HISTORY: Family History  Problem Relation Age of Onset  . Heart disease Mother   . Stroke Father     SOCIAL HISTORY: Social History   Socioeconomic History  . Marital status: Married    Spouse name: Not on file  . Number of children: 2  . Years of education: college  . Highest education level: Not on file  Occupational History  . Occupation: Retired  Scientific laboratory technician  . Financial resource strain: Not on file  . Food insecurity:    Worry: Not on file    Inability: Not  on file  . Transportation needs:    Medical: Not on file    Non-medical: Not on file  Tobacco Use  . Smoking status: Former Smoker    Last attempt to quit: 2002    Years since quitting: 17.7  . Smokeless tobacco: Never Used  Substance and Sexual Activity  . Alcohol use: Yes    Comment: 1 beer per week  . Drug use: Never  . Sexual activity: Not on file  Lifestyle  . Physical activity:    Days per week: Not on file    Minutes per session: Not on file  . Stress: Not on file  Relationships  . Social connections:    Talks on phone: Not on file    Gets together: Not on file    Attends religious service: Not on file    Active member of club or organization: Not on  file    Attends meetings of clubs or organizations: Not on file    Relationship status: Not on file  . Intimate partner violence:    Fear of current or ex partner: Not on file    Emotionally abused: Not on file    Physically abused: Not on file    Forced sexual activity: Not on file  Other Topics Concern  . Not on file  Social History Narrative   Lives at home with his wife.   Right-handed.   2 cups caffeine per day.     PHYSICAL EXAM No prolonged work of weight weight Vitals:   01/30/18 0956  BP: 129/83  Pulse: 67  Weight: 224 lb 8 oz (101.8 kg)  Height: 6' (1.829 m)    Not recorded      Body mass index is 30.45 kg/m.  PHYSICAL EXAMNIATION:  Gen: NAD, conversant, well nourised, obese, well groomed                     Cardiovascular: Regular rate rhythm, no peripheral edema, warm, nontender. Eyes: Conjunctivae clear without exudates or hemorrhage Neck: Supple, no carotid bruits. Pulmonary: Clear to auscultation bilaterally   NEUROLOGICAL EXAM:  MENTAL STATUS: Speech:    Speech is normal; fluent and spontaneous with normal comprehension.  Cognition:     Orientation to time, place and person     Normal recent and remote memory     Normal Attention span and concentration     Normal Language, naming, repeating,spontaneous speech     Fund of knowledge   CRANIAL NERVES: CN II: Visual fields are full to confrontation. Fundoscopic exam is normal with sharp discs and no vascular changes. Pupils are round equal and briskly reactive to light. CN III, IV, VI: extraocular movement are normal. No ptosis. CN V: Facial sensation is intact to pinprick in all 3 divisions bilaterally. Corneal responses are intact.  CN VII: Face is symmetric with normal eye closure and smile. CN VIII: Hearing is normal to rubbing fingers CN IX, X: Palate elevates symmetrically. Phonation is normal. CN XI: Head turning and shoulder shrug are intact CN XII: Tongue is midline with normal  movements and no atrophy.  MOTOR: He has mild bilateral toe extension flexion weakness, no proximal muscle weakness.  REFLEXES: Reflexes are 2+ and symmetric at the biceps, triceps, knees, and absent at ankles. Plantar responses are flexor.  SENSORY: Length dependent decreased to light touch, pinprick, vibratory sensation to distal leg.  COORDINATION: Rapid alternating movements and fine finger movements are intact. There is no dysmetria on finger-to-nose and heel-knee-shin.  GAIT/STANCE: Wide-based, mildly unsteady gait, difficulty performing tiptoe, heel, tandem walking Romberg is mildly positive   DIAGNOSTIC DATA (LABS, IMAGING, TESTING) - I reviewed patient records, labs, notes, testing and imaging myself where available.   ASSESSMENT AND PLAN  AQUILES RUFFINI is a 72 y.o. male   Peripheral neuropathy  Evidence of mild distal weakness, length dependent sensory changes, absent ankle reflex  EMG nerve conduction study  laboratory evaluation from his pcp  Marcial Pacas, M.D. Ph.D.  [facility] [address], [place], [place] [address] Ph: [phone] Fax: [phone]  CC: [name], MD

## 2018-02-05 ENCOUNTER — Other Ambulatory Visit: Payer: Self-pay | Admitting: Neurology

## 2018-02-05 ENCOUNTER — Other Ambulatory Visit (INDEPENDENT_AMBULATORY_CARE_PROVIDER_SITE_OTHER): Payer: Self-pay

## 2018-02-05 DIAGNOSIS — R202 Paresthesia of skin: Secondary | ICD-10-CM | POA: Diagnosis not present

## 2018-02-05 DIAGNOSIS — Z0289 Encounter for other administrative examinations: Secondary | ICD-10-CM

## 2018-02-05 DIAGNOSIS — R799 Abnormal finding of blood chemistry, unspecified: Secondary | ICD-10-CM | POA: Diagnosis not present

## 2018-02-05 DIAGNOSIS — D582 Other hemoglobinopathies: Secondary | ICD-10-CM | POA: Diagnosis not present

## 2018-02-05 NOTE — Progress Notes (Signed)
Lab order were placed for PN  Reviewed laboratory evaluations from his primary care physician on November 16, 2017, normal CBC hemoglobin of 15.3, CMP, creatinine of 1.5, elevated triglyceride 204, LDL was 65

## 2018-02-08 LAB — HEMOGLOBIN A1C
ESTIMATED AVERAGE GLUCOSE: 108 mg/dL
Hgb A1c MFr Bld: 5.4 % (ref 4.8–5.6)

## 2018-02-08 LAB — CK: Total CK: 136 U/L (ref 24–204)

## 2018-02-08 LAB — PROTEIN ELECTROPHORESIS
A/G RATIO SPE: 1.4 (ref 0.7–1.7)
Albumin ELP: 3.9 g/dL (ref 2.9–4.4)
Alpha 1: 0.2 g/dL (ref 0.0–0.4)
Alpha 2: 0.6 g/dL (ref 0.4–1.0)
Beta: 1.1 g/dL (ref 0.7–1.3)
GAMMA GLOBULIN: 0.9 g/dL (ref 0.4–1.8)
GLOBULIN, TOTAL: 2.8 g/dL (ref 2.2–3.9)
TOTAL PROTEIN: 6.7 g/dL (ref 6.0–8.5)

## 2018-02-08 LAB — SEDIMENTATION RATE: SED RATE: 20 mm/h (ref 0–30)

## 2018-02-08 LAB — FOLATE: Folate: 8.7 ng/mL (ref >3.0)

## 2018-02-08 LAB — C-REACTIVE PROTEIN: CRP: 1 mg/L (ref 0–10)

## 2018-02-08 LAB — COPPER, SERUM: Copper: 76 ug/dL (ref 72–166)

## 2018-02-08 LAB — VITAMIN B12: VITAMIN B 12: 367 pg/mL (ref 232–1245)

## 2018-02-08 LAB — ANA W/REFLEX IF POSITIVE: ANA: NEGATIVE

## 2018-02-08 LAB — RPR: RPR: NONREACTIVE

## 2018-02-08 LAB — TSH: TSH: 1.2 u[IU]/mL (ref 0.450–4.500)

## 2018-02-08 LAB — B. BURGDORFI ANTIBODIES

## 2018-03-01 ENCOUNTER — Ambulatory Visit (INDEPENDENT_AMBULATORY_CARE_PROVIDER_SITE_OTHER): Payer: Medicare Other | Admitting: Neurology

## 2018-03-01 DIAGNOSIS — G6289 Other specified polyneuropathies: Secondary | ICD-10-CM | POA: Diagnosis not present

## 2018-03-01 MED ORDER — LIDOCAINE-PRILOCAINE 2.5-2.5 % EX CREA: 1.0000 "application " | TOPICAL_CREAM | CUTANEOUS | 11 refills | Status: DC | PRN

## 2018-03-01 MED ORDER — DICLOFENAC SODIUM 1 % TD GEL: 4.0000 g | Freq: Four times a day (QID) | TRANSDERMAL | 11 refills | Status: DC

## 2018-03-01 NOTE — Progress Notes (Signed)
PATIENT: Timothy Schroeder DOB: 1945-09-03  No chief complaint on file.    HISTORICAL  Timothy Schroeder is a 72 years old male, seen in request by his primary care physician Dr. Nevada Crane, [name] for evaluation of numbness, initial evaluation was on January 30, 2018.  I have reviewed and summarized the referring note from the referring physician.  He had a past medical history of hypertension, hyperlipidemia, body mass index of 32, sleep apnea, using CPAP machine, kidney donor  He reported a history of gradual onset bilateral feet paresthesia since 2012, initially the numbness tingling involving plantar surface, gradually ascending to toes, now to ankle level, at the end of the day, after bearing weight for couple hours, he began to notice worsening paresthesia of bilateral feet, deep achy pain, he denies significant low back pain, denies upper extremity paresthesia, he denies bowel and bladder incontinence, but in recent years, he noticed mildly unsteady gait, difficulty keeping up his balance, tends to trip.  He reported recent laboratory evaluation from his primary care physician  UPDATE Nov 8th 2019: We reviewed laboratory evaluation in October 2019 which showed normal negative L89, RPR, folic acid, C-reactive protein, TSH, CPK, copper level, ANA, electrophoresis, A1c, ESR, Lyme titer,  EMG nerve conduction study on March 01, 2018 showed mild length dependent axonal sensorimotor polyneuropathy.  REVIEW OF SYSTEMS: Full 14 system review of systems performed and notable only for as above All other review of systems were negative.  ALLERGIES: No Known Allergies  HOME MEDICATIONS: Current Outpatient Medications  Medication Sig Dispense Refill  . ACETAMINOPHEN PO Take 1,000 mg by mouth daily.    . Ascorbic Acid (VITAMIN C) 1000 MG tablet Take 1,000 mg by mouth daily.    [name] aspirin EC 81 MG tablet Take 81 mg by mouth daily.    . cholecalciferol (VITAMIN D) 1000 units tablet Take 1,000 Units by  mouth daily.    [name] docusate sodium (COLACE) 100 MG capsule Take 200 mg by mouth daily.    [name] lisinopril (PRINIVIL,ZESTRIL) 20 MG tablet Take 20 mg by mouth daily.    . pravastatin (PRAVACHOL) 40 MG tablet Take 40 mg by mouth at bedtime.    . TURMERIC PO Take 800 mg by mouth daily.     No current facility-administered medications for this visit.     PAST MEDICAL HISTORY: Past Medical History:  Diagnosis Date  . Hypercholesteremia   . Hypertension   . Kidney donor   . Numbness    feet    PAST SURGICAL HISTORY: Past Surgical History:  Procedure Laterality Date  . BACK SURGERY  1996  . COLONOSCOPY N/A 07/08/2014   Procedure: COLONOSCOPY;  Surgeon: [name], MD;  [facility];  Service: Endoscopy;  Laterality: N/A;  1030 - moved to 11:55 - Ann to notify pt  . NEPHRECTOMY  2003   Left    FAMILY HISTORY: Family History  Problem Relation Age of Onset  . Heart disease Mother   . Stroke Father     SOCIAL HISTORY: Social History   Socioeconomic History  . Marital status: Married    Spouse name: Not on file  . Number of children: 2  . Years of education: college  . Highest education level: Not on file  Occupational History  . Occupation: Retired  Scientific laboratory technician  . Financial resource strain: Not on file  . Food insecurity:    Worry: Not on file    Inability: Not on file  .  Transportation needs:    Medical: Not on file    Non-medical: Not on file  Tobacco Use  . Smoking status: Former Smoker    Last attempt to quit: 2002    Years since quitting: 17.8  . Smokeless tobacco: Never Used  Substance and Sexual Activity  . Alcohol use: Yes    Comment: 1 beer per week  . Drug use: Never  . Sexual activity: Not on file  Lifestyle  . Physical activity:    Days per week: Not on file    Minutes per session: Not on file  . Stress: Not on file  Relationships  . Social connections:    Talks on phone: Not on file    Gets together: Not on file    Attends  religious service: Not on file    Active member of club or organization: Not on file    Attends meetings of clubs or organizations: Not on file    Relationship status: Not on file  . Intimate partner violence:    Fear of current or ex partner: Not on file    Emotionally abused: Not on file    Physically abused: Not on file    Forced sexual activity: Not on file  Other Topics Concern  . Not on file  Social History Narrative   Lives at home with his wife.   Right-handed.   2 cups caffeine per day.     PHYSICAL EXAM No prolonged work of weight weight There were no vitals filed for this visit.  Not recorded      There is no height or weight on file to calculate BMI.  PHYSICAL EXAMNIATION:  Gen: NAD, conversant, well nourised, obese, well groomed                     Cardiovascular: Regular rate rhythm, no peripheral edema, warm, nontender. Eyes: Conjunctivae clear without exudates or hemorrhage Neck: Supple, no carotid bruits. Pulmonary: Clear to auscultation bilaterally   NEUROLOGICAL EXAM:  MENTAL STATUS: Speech:    Speech is normal; fluent and spontaneous with normal comprehension.  Cognition:     Orientation to time, place and person     Normal recent and remote memory     Normal Attention span and concentration     Normal Language, naming, repeating,spontaneous speech     Fund of knowledge   CRANIAL NERVES: CN II: Visual fields are full to confrontation.Pupils are round equal and briskly reactive to light. CN III, IV, VI: extraocular movement are normal. No ptosis. CN V: Facial sensation is intact to pinprick in all 3 divisions bilaterally. Corneal responses are intact.  CN VII: Face is symmetric with normal eye closure and smile. CN VIII: Hearing is normal to rubbing fingers CN IX, X: Palate elevates symmetrically. Phonation is normal. CN XI: Head turning and shoulder shrug are intact CN XII: Tongue is midline with normal movements and no  atrophy.  MOTOR: There is no significant muscle weakness noted.  REFLEXES: Reflexes are 1 and symmetric at the biceps, triceps, knees, and absent at ankles. Plantar responses are flexor.  SENSORY: Length dependent decreased to light touch, pinprick, vibratory sensation to distal leg.  COORDINATION: Rapid alternating movements and fine finger movements are intact. There is no dysmetria on finger-to-nose and heel-knee-shin.    GAIT/STANCE: Wide-based, mildly unsteady gait, difficulty performing tiptoe, heel, tandem walking Romberg is mildly positive   DIAGNOSTIC DATA (LABS, IMAGING, TESTING) - I reviewed patient records, labs, notes, testing and  imaging myself where available.   ASSESSMENT AND PLAN  Timothy Schroeder is a 73 y.o. male   Peripheral neuropathy  Evidence of mild distal weakness, length dependent sensory changes, absent ankle reflex  EMG nerve conduction study confirmed length dependent axonal sensorimotor polyneuropathy  laboratory evaluation showed no treatable etiology  Lidocaine Gel and Diclofenac gel prn  Marcial Pacas, M.D. Ph.D.  [facility] [address], [place], [place] [address] Ph: [phone] Fax: [phone]  CC: [name], MD

## 2018-03-01 NOTE — Progress Notes (Signed)
PATIENT: Timothy Schroeder DOB: 04-20-1946  No chief complaint on file.    HISTORICAL  Timothy Schroeder is a 72 years old male, seen in request by his primary care physician Dr. Nevada Schroeder, [name] for evaluation of numbness, initial evaluation was on January 30, 2018.  I have reviewed and summarized the referring note from the referring physician.  He had a past medical history of hypertension, hyperlipidemia, body mass index of 32, sleep apnea, using CPAP machine, kidney donor  He reported a history of gradual onset bilateral feet paresthesia since 2012, initially the numbness tingling involving plantar surface, gradually ascending to toes, now to ankle level, at the end of the day, after bearing weight for couple hours, he began to notice worsening paresthesia of bilateral feet, deep achy pain, he denies significant low back pain, denies upper extremity paresthesia, he denies bowel and bladder incontinence, but in recent years, he noticed mildly unsteady gait, difficulty keeping up his balance, tends to trip.  He reported recent laboratory evaluation from his primary care physician  REVIEW OF SYSTEMS: Full 14 system review of systems performed and notable only for as above All other review of systems were negative.  ALLERGIES: No Known Allergies  HOME MEDICATIONS: Current Outpatient Medications  Medication Sig Dispense Refill  . ACETAMINOPHEN PO Take 1,000 mg by mouth daily.    . Ascorbic Acid (VITAMIN C) 1000 MG tablet Take 1,000 mg by mouth daily.    [name] aspirin EC 81 MG tablet Take 81 mg by mouth daily.    . cholecalciferol (VITAMIN D) 1000 units tablet Take 1,000 Units by mouth daily.    [name] docusate sodium (COLACE) 100 MG capsule Take 200 mg by mouth daily.    [name] lisinopril (PRINIVIL,ZESTRIL) 20 MG tablet Take 20 mg by mouth daily.    . pravastatin (PRAVACHOL) 40 MG tablet Take 40 mg by mouth at bedtime.    . TURMERIC PO Take 800 mg by mouth daily.     No current facility-administered  medications for this visit.     PAST MEDICAL HISTORY: Past Medical History:  Diagnosis Date  . Hypercholesteremia   . Hypertension   . Kidney donor   . Numbness    feet    PAST SURGICAL HISTORY: Past Surgical History:  Procedure Laterality Date  . BACK SURGERY  1996  . COLONOSCOPY N/A 07/08/2014   Procedure: COLONOSCOPY;  Surgeon: [name], MD;  [facility];  Service: Endoscopy;  Laterality: N/A;  1030 - moved to 11:55 - Ann to notify pt  . NEPHRECTOMY  2003   Left    FAMILY HISTORY: Family History  Problem Relation Age of Onset  . Heart disease Mother   . Stroke Father     SOCIAL HISTORY: Social History   Socioeconomic History  . Marital status: Married    Spouse name: Not on file  . Number of children: 2  . Years of education: college  . Highest education level: Not on file  Occupational History  . Occupation: Retired  Scientific laboratory technician  . Financial resource strain: Not on file  . Food insecurity:    Worry: Not on file    Inability: Not on file  . Transportation needs:    Medical: Not on file    Non-medical: Not on file  Tobacco Use  . Smoking status: Former Smoker    Last attempt to quit: 2002    Years since quitting: 17.8  . Smokeless tobacco: Never Used  Substance and Sexual Activity  . Alcohol use: Yes    Comment: 1 beer per week  . Drug use: Never  . Sexual activity: Not on file  Lifestyle  . Physical activity:    Days per week: Not on file    Minutes per session: Not on file  . Stress: Not on file  Relationships  . Social connections:    Talks on phone: Not on file    Gets together: Not on file    Attends religious service: Not on file    Active member of club or organization: Not on file    Attends meetings of clubs or organizations: Not on file    Relationship status: Not on file  . Intimate partner violence:    Fear of current or ex partner: Not on file    Emotionally abused: Not on file    Physically abused: Not on  file    Forced sexual activity: Not on file  Other Topics Concern  . Not on file  Social History Narrative   Lives at home with his wife.   Right-handed.   2 cups caffeine per day.     PHYSICAL EXAM No prolonged work of weight weight There were no vitals filed for this visit.  Not recorded      There is no height or weight on file to calculate BMI.  PHYSICAL EXAMNIATION:  Gen: NAD, conversant, well nourised, obese, well groomed                     Cardiovascular: Regular rate rhythm, no peripheral edema, warm, nontender. Eyes: Conjunctivae clear without exudates or hemorrhage Neck: Supple, no carotid bruits. Pulmonary: Clear to auscultation bilaterally   NEUROLOGICAL EXAM:  MENTAL STATUS: Speech:    Speech is normal; fluent and spontaneous with normal comprehension.  Cognition:     Orientation to time, place and person     Normal recent and remote memory     Normal Attention span and concentration     Normal Language, naming, repeating,spontaneous speech     Fund of knowledge   CRANIAL NERVES: CN II: Visual fields are full to confrontation. Fundoscopic exam is normal with sharp discs and no vascular changes. Pupils are round equal and briskly reactive to light. CN III, IV, VI: extraocular movement are normal. No ptosis. CN V: Facial sensation is intact to pinprick in all 3 divisions bilaterally. Corneal responses are intact.  CN VII: Face is symmetric with normal eye closure and smile. CN VIII: Hearing is normal to rubbing fingers CN IX, X: Palate elevates symmetrically. Phonation is normal. CN XI: Head turning and shoulder shrug are intact CN XII: Tongue is midline with normal movements and no atrophy.  MOTOR: He has mild bilateral toe extension flexion weakness, no proximal muscle weakness.  REFLEXES: Reflexes are 2+ and symmetric at the biceps, triceps, knees, and absent at ankles. Plantar responses are flexor.  SENSORY: Length dependent decreased to light  touch, pinprick, vibratory sensation to distal leg.  COORDINATION: Rapid alternating movements and fine finger movements are intact. There is no dysmetria on finger-to-nose and heel-knee-shin.    GAIT/STANCE: Wide-based, mildly unsteady gait, difficulty performing tiptoe, heel, tandem walking Romberg is mildly positive   DIAGNOSTIC DATA (LABS, IMAGING, TESTING) - I reviewed patient records, labs, notes, testing and imaging myself where available.   ASSESSMENT AND PLAN  MARIE CHOW is a 72 y.o. male   Peripheral neuropathy  Evidence of mild distal weakness, length dependent  sensory changes, absent ankle reflex  EMG nerve conduction study  laboratory evaluation from his pcp  Marcial Pacas, M.D. Ph.D.  [facility] [address], [place], [place] [address] Ph: [phone] Fax: [phone]  CC: [name], MD

## 2018-03-01 NOTE — Addendum Note (Signed)
Addended by: Marcial Pacas on: 03/01/2018 11:11 AM   Modules accepted: Level of Service

## 2018-03-01 NOTE — Procedures (Signed)
Full Name: Timothy Schroeder Gender: Male MRN #: [id number] Date of Birth: 1945/10/07    Visit Date: 03/01/2018 09:10 Age: 72 Years 33 Months Old Examining Physician: Marcial Pacas, MD  Referring Physician: Krista Blue, MD History: 72 years old male, presented with length dependent gradual worsening bilateral feet and lower extremity paresthesia  Summary of the tests:  Nerve conduction study: Bilateral superficial peroneal sensory responses were absent.  Bilateral sural sensory responses showed mildly prolonged peak latency, with severe to moderately decreased snap amplitude.  Bilateral tibial motor responses showed moderately decreased the C map amplitude, moderate slow conduction velocity, and moderately prolonged F-wave latency, right side also has mildly prolonged distal latency.  Bilateral peroneal to EDB motor responses showed mild slow conduction velocity, but otherwise within normal limits.  Left ulnar sensory responses showed mildly prolonged peak latency, with moderately decreased snap amplitude. Left radial sensory responses also showed normal peak latency, with mildly slow conduction velocity. Left ulnar motor responses were within normal limits.   Electromyography: Selective needle examination of right lower extremity muscles in the right lumbosacral paraspinal muscles showed no significant abnormality.  Conclusion: This is an abnormal study.  There is electrodiagnostic evidence of length dependent axonal sensorimotor polyneuropathy.  There is no evidence of right lumbosacral radiculopathy.    ------------------------------- Marcial Pacas M.D. PhD.  [facility] [place], [place] [address] Tel: [phone] Fax: [phone]        [facility]    Nerve / Sites Muscle Latency Ref. Amplitude Ref. Rel Amp Segments Distance Velocity Ref. Area    ms ms mV mV %  cm m/s m/s mVms  L Ulnar - ADM     Wrist ADM 3.2 ?3.3 8.7 ?6.0 100 Wrist - ADM 7   25.5   B.Elbow ADM 7.1  6.9  78.9 B.Elbow - Wrist 21 54 ?49 21.8     A.Elbow ADM 9.2  6.6  95.5 A.Elbow - B.Elbow 10 47 ?49 22.8         A.Elbow - Wrist      R Peroneal - EDB     Ankle EDB 5.6 ?6.5 3.5 ?2.0 100 Ankle - EDB 9   10.5     Fib head EDB 14.0  3.1  86.1 Fib head - Ankle 33 39 ?44 9.4     Pop fossa EDB 16.7  3.0  98.8 Pop fossa - Fib head 10 38 ?44 9.1         Pop fossa - Ankle      L Peroneal - EDB     Ankle EDB 5.6 ?6.5 2.4 ?2.0 100 Ankle - EDB 9   7.9     Fib head EDB 14.5  1.7  71.5 Fib head - Ankle 33 37 ?44 7.2     Pop fossa EDB 17.4  2.1  123 Pop fossa - Fib head 10 34 ?44 8.0     Acc Peron EDB      Pop fossa - Ankle             Acc Peron - Pop fossa      R Tibial - AH     Ankle AH 6.2 ?5.8 1.6 ?4.0 100 Ankle - AH 9   4.8     Pop fossa AH 18.4  1.2  77.5 Pop fossa - Ankle 42 34 ?41 4.3  L Tibial - AH     Ankle AH 5.5 ?5.8 2.3 ?4.0 100 Ankle - AH 9  10.3     Pop fossa AH 18.0  1.8  76.4 Pop fossa - Ankle 42 34 ?41 8.5               SNC    Nerve / Sites Rec. Site Peak Lat Ref.  Amp Ref. Segments Distance    ms ms V V  cm  L Radial - Anatomical snuff box (Forearm)     Forearm Wrist 2.8 ?2.9 7 ?15 Forearm - Wrist 10  R Sural - Ankle (Calf)     Calf Ankle 4.5 ?4.4 1 ?6 Calf - Ankle 14  L Sural - Ankle (Calf)     Calf Ankle 4.4 ?4.4 3 ?6 Calf - Ankle 14  R Superficial peroneal - Ankle     Lat leg Ankle NR ?4.4 NR ?6 Lat leg - Ankle 14  L Superficial peroneal - Ankle     Lat leg Ankle NR ?4.4 NR ?6 Lat leg - Ankle 14  L Ulnar - Orthodromic, (Dig V, Mid palm)     Dig V Wrist 3.2 ?3.1 2 ?5 Dig V - Wrist 11                 F  Wave    Nerve F Lat Ref.   ms ms  R Tibial - AH 67.1 ?56.0  L Tibial - AH 61.1 ?56.0  L Ulnar - ADM 31.5 ?32.0           EMG full       EMG Summary Table    Spontaneous MUAP Recruitment  Muscle IA Fib PSW Fasc Other Amp Dur. Poly Pattern  R. Tibialis anterior Normal None None None _______ Normal Normal Normal Normal  R. Peroneus longus  Normal None None None _______ Normal Normal Normal Normal  R. Abductor hallucis Increased None None None _______ Increased Increased Normal Reduced  R. Vastus lateralis Normal None None None _______ Normal Normal Normal Normal  R. Lumbar paraspinals (mid) Normal None None None _______ Normal Normal Normal Normal  R. Lumbar paraspinals (low) Normal None None None _______ Normal Normal Normal Normal  R. Extensor digitorum brevis Increased None None None _______ Increased Normal Normal Reduced

## 2018-03-04 ENCOUNTER — Telehealth: Payer: Self-pay | Admitting: *Deleted

## 2018-03-04 NOTE — Telephone Encounter (Signed)
Initiated lidocaine-prilocaine cream on CMM. Key: [id number] - PA Case ID: [id number] - Rx #: [id number]. Waiting on determination from [facility] part D.

## 2018-03-04 NOTE — Telephone Encounter (Signed)
Submitted PA diclofenac gel 1% on CMM. Key: [id number]. Waiting on determination.

## 2018-03-04 NOTE — Telephone Encounter (Signed)
Received fax notification from [place] that [place] approved 04/22/17-04/23/18.

## 2018-03-04 NOTE — Telephone Encounter (Signed)
Received fax notification from [place] that [place] approved 04/22/17-06/04/18.

## 2018-04-25 ENCOUNTER — Ambulatory Visit (INDEPENDENT_AMBULATORY_CARE_PROVIDER_SITE_OTHER): Payer: Medicare Other | Admitting: Orthopaedic Surgery

## 2018-04-25 ENCOUNTER — Ambulatory Visit (INDEPENDENT_AMBULATORY_CARE_PROVIDER_SITE_OTHER): Payer: Self-pay

## 2018-04-25 ENCOUNTER — Encounter (INDEPENDENT_AMBULATORY_CARE_PROVIDER_SITE_OTHER): Payer: Self-pay | Admitting: Orthopaedic Surgery

## 2018-04-25 VITALS — BP 145/90 | HR 66 | Ht 71.0 in | Wt 235.0 lb

## 2018-04-25 DIAGNOSIS — M25561 Pain in right knee: Secondary | ICD-10-CM | POA: Diagnosis not present

## 2018-04-25 MED ORDER — LIDOCAINE HCL 1 % IJ SOLN
2.0000 mL | INTRAMUSCULAR | Status: AC | PRN
  Administered 2018-04-25: 2 mL

## 2018-04-25 MED ORDER — BUPIVACAINE HCL 0.5 % IJ SOLN
2.0000 mL | INTRAMUSCULAR | Status: AC | PRN
  Administered 2018-04-25: 2 mL via INTRA_ARTICULAR

## 2018-04-25 MED ORDER — METHYLPREDNISOLONE ACETATE 40 MG/ML IJ SUSP
80.0000 mg | INTRAMUSCULAR | Status: AC | PRN
  Administered 2018-04-25: 80 mg

## 2018-04-25 NOTE — Progress Notes (Deleted)
Rt knee--pain, no injury, worse at night for 1 month.   Office Visit Note   Patient: Timothy Schroeder           Date of Birth: 04-05-1946           MRN: [id number] Visit Date: 04/25/2018              Requested by: [name], MD [address] [place], [place] [address] PCP: [name], MD   Assessment & Plan: Visit Diagnoses: No diagnosis found.  Plan: ***  Follow-Up Instructions: No follow-ups on file.   Orders:  No orders of the defined types were placed in this encounter.  No orders of the defined types were placed in this encounter.     Procedures: No procedures performed   Clinical Data: No additional findings.   Subjective: No chief complaint on file.   HPI  Review of Systems  Constitutional: Negative.   HENT: Negative.   Eyes: Negative.   Respiratory: Positive for apnea.   Cardiovascular: Negative.   Gastrointestinal: Negative.   Endocrine: Negative.   Genitourinary: Negative.   Musculoskeletal: Positive for gait problem.  Skin: Negative.   Allergic/Immunologic: Negative.   Hematological: Negative.   Psychiatric/Behavioral: Negative.      Objective: Vital Signs: BP (!) 145/90   Pulse 66   Ht 5\' 11"  (1.803 m)   Wt 235 lb (106.6 kg)   BMI 32.78 kg/m   Physical Exam  Ortho Exam  Specialty Comments:  No specialty comments available.  Imaging: No results found.   PMFS History: Patient Active Problem List   Diagnosis Date Noted  . Peripheral neuropathy 01/30/2018  . Rotator cuff syndrome of right shoulder 01/14/2013  . Partial tear of right rotator cuff 01/06/2013  . Acute pain of right shoulder 01/06/2013  . KNEE, ARTHRITIS, DEGEN./OSTEO 06/02/2009  . DERANGEMENT MENISCUS 06/02/2009  . KNEE PAIN 06/02/2009   Past Medical History:  Diagnosis Date  . Hypercholesteremia   . Hypertension   . Kidney donor   . Numbness    feet    Family History  Problem Relation Age of Onset  . Heart disease Mother   . Stroke Father     Past Surgical History:  Procedure Laterality Date  . BACK SURGERY  1996  . COLONOSCOPY N/A 07/08/2014   Procedure: COLONOSCOPY;  Surgeon: [name], MD;  [facility];  Service: Endoscopy;  Laterality: N/A;  1030 - moved to 11:55 - Ann to notify pt  . NEPHRECTOMY  2003   Left   Social History   Occupational History  . Occupation: Retired  Tobacco Use  . Smoking status: Former Smoker    Last attempt to quit: 2002    Years since quitting: 18.0  . Smokeless tobacco: Never Used  Substance and Sexual Activity  . Alcohol use: Yes    Comment: 1 beer per week  . Drug use: Never  . Sexual activity: Not on file

## 2018-04-25 NOTE — Progress Notes (Signed)
Office Visit Note   Patient: Timothy Schroeder           Date of Birth: [birth year]           MRN: [id number] Visit Date: 04/25/2018              Requested by: [name], MD [place], [place] [address] PCP: [name], MD   Assessment & Plan: Visit Diagnoses:  1. Right knee pain, unspecified chronicity     Plan: Osteoarthritis right knee.  Will inject with cortisone and monitor response  Follow-Up Instructions: Return if symptoms worsen or fail to improve.   Orders:  Orders Placed This Encounter  Procedures  . XR KNEE 3 VIEW RIGHT   No orders of the defined types were placed in this encounter.     Procedures: Large Joint Inj: R knee on 04/25/2018 3:50 PM Indications: pain and diagnostic evaluation Details: 25 G 1.5 in needle, anteromedial approach  Arthrogram: No  Medications: 2 mL lidocaine 1 %; 2 mL bupivacaine 0.5 %; 80 mg methylPREDNISolone acetate 40 MG/ML Aspirate: 2 mL clear Procedure, treatment alternatives, risks and benefits explained, specific risks discussed. Consent was given by the patient. Immediately prior to procedure a time out was called to verify the correct patient, procedure, equipment, support staff and site/side marked as required. Patient was prepped and draped in the usual sterile fashion.       Clinical Data: No additional findings.   Subjective: Chief Complaint  Patient presents with  . Right Knee - Pain  Mr. Timothy Schroeder is 73 years old visited the office evaluation of right knee pain.  Over the last week he has had swelling and pain in his right knee without history of injury or trauma.  He has had some difficulty bearing weight particularly on the medial compartment.  Is a little bit better over the last several days.  HPI  Review of Systems   Objective: Vital Signs: BP (!) 145/90   Pulse 66   Ht 5\' 11"  (1.803 m)   Wt 235 lb (106.6 kg)   BMI 32.78 kg/m   Physical Exam Constitutional:      Appearance: He is  well-developed.  Eyes:     Pupils: Pupils are equal, round, and reactive to light.  Pulmonary:     Effort: Pulmonary effort is normal.  Skin:    General: Skin is warm and dry.  Neurological:     Mental Status: He is alert and oriented to person, place, and time.  Psychiatric:        Behavior: Behavior normal.     Ortho Exam awake alert and oriented x3.  Comfortable sitting.  Small effusion right knee.  Little bit of varus.  Predominant medial joint pain.  No lateral pain or patellofemoral pain.  Full extension and flexion over 105 degrees without instability.  No calf pain.  No distal edema  Specialty Comments:  No specialty comments available.  Imaging: Xr Knee 3 View Right  Result Date: 04/25/2018 Limbs of the right knee were obtained in 3 projections standing.  There are degenerative changes in all 3 compartments.  The joint spaces are fairly well maintained but there are peripheral osteophytes medially and laterally with some mild subchondral sclerosis in both sides of the joint.  No ectopic calcification.  No acute changes.  Degenerative changes are also identified at the patellofemoral joint but the joint spaces are well-maintained.  There are peripheral osteophytes  PMFS History: Patient Active Problem List   Diagnosis Date Noted  . Peripheral neuropathy 01/30/2018  . Rotator cuff syndrome of right shoulder 01/14/2013  . Partial tear of right rotator cuff 01/06/2013  . Acute pain of right shoulder 01/06/2013  . KNEE, ARTHRITIS, DEGEN./OSTEO 06/02/2009  . DERANGEMENT MENISCUS 06/02/2009  . KNEE PAIN 06/02/2009   Past Medical History:  Diagnosis Date  . Hypercholesteremia   . Hypertension   . Kidney donor   . Numbness    feet    Family History  Problem Relation Age of Onset  . Heart disease Mother   . Stroke Father     Past Surgical History:  Procedure Laterality Date  . BACK SURGERY  1996  . COLONOSCOPY N/A 07/08/2014   Procedure: COLONOSCOPY;  Surgeon:  [name], MD;  [facility];  Service: Endoscopy;  Laterality: N/A;  1030 - moved to 11:55 - Ann to notify pt  . NEPHRECTOMY  2003   Left   Social History   Occupational History  . Occupation: Retired  Tobacco Use  . Smoking status: Former Smoker    Last attempt to quit: 2002    Years since quitting: 18.0  . Smokeless tobacco: Never Used  Substance and Sexual Activity  . Alcohol use: Yes    Comment: 1 beer per week  . Drug use: Never  . Sexual activity: Not on file     Garald Balding, MD   Note - This record has been created using [employer].  Chart creation errors have been sought, but may not always  have been located. Such creation errors do not reflect on  the standard of medical care.

## 2018-05-15 ENCOUNTER — Ambulatory Visit (INDEPENDENT_AMBULATORY_CARE_PROVIDER_SITE_OTHER): Payer: Medicare Other | Admitting: Orthopaedic Surgery

## 2018-05-15 ENCOUNTER — Encounter (INDEPENDENT_AMBULATORY_CARE_PROVIDER_SITE_OTHER): Payer: Self-pay | Admitting: Orthopaedic Surgery

## 2018-05-15 ENCOUNTER — Other Ambulatory Visit (HOSPITAL_COMMUNITY): Payer: Self-pay | Admitting: Orthopaedic Surgery

## 2018-05-15 VITALS — BP 127/88 | HR 60 | Ht 71.0 in | Wt 217.0 lb

## 2018-05-15 DIAGNOSIS — M25561 Pain in right knee: Secondary | ICD-10-CM

## 2018-05-15 NOTE — Progress Notes (Signed)
Office Visit Note   Patient: Timothy Schroeder           Date of Birth: 09-05-1945           MRN: [id number] Visit Date: 05/15/2018              Requested by: [name], MD [place], [place] [address] PCP: [name], MD   Assessment & Plan: Visit Diagnoses:  1. Arthralgia of right lower leg     Plan: Persistent pain right knee 2 weeks after cortisone injection with mechanical signs.  Will order MRI scan  Follow-Up Instructions: Return after MRI right knee.   Orders:  No orders of the defined types were placed in this encounter.  No orders of the defined types were placed in this encounter.     Procedures: No procedures performed   Clinical Data: No additional findings.   Subjective: Chief Complaint  Patient presents with  . Right Knee - Pain  Patient returns with continued right knee pain. He had a cortisone injection on 04/25/2018 which helped, but only for a short while. He states that his knee "catches", and when it does, he is unable to walk. He denies that his knee feels weak. He also denies swelling. He takes advil or aleve with some relief.  Does have some degenerative changes by plain film but joint spaces are still maintained.  Had prior left knee arthroscopy for meniscal tear and is concerned that he may have the same in the right knee  HPI  Review of Systems   Objective: Vital Signs: BP 127/88   Pulse 60   Ht 5\' 11"  (1.803 m)   Wt 217 lb (98.4 kg)   BMI 30.27 kg/m   Physical Exam Constitutional:      Appearance: He is well-developed.  Eyes:     Pupils: Pupils are equal, round, and reactive to light.  Pulmonary:     Effort: Pulmonary effort is normal.  Skin:    General: Skin is warm and dry.  Neurological:     Mental Status: He is alert and oriented to person, place, and time.  Psychiatric:        Behavior: Behavior normal.     Ortho Exam right knee with positive effusion.  Predominately lateral joint pain.  No patellar  crepitation.  No instability.  No calf pain or popliteal mass.  Does have history of peripheral neuropathy with decreased sensibility in both feet but no pain or burning.  Etiology unknown per his neurologist Specialty Comments:  No specialty comments available.  Imaging: No results found.   PMFS History: Patient Active Problem List   Diagnosis Date Noted  . Peripheral neuropathy 01/30/2018  . Rotator cuff syndrome of right shoulder 01/14/2013  . Partial tear of right rotator cuff 01/06/2013  . Acute pain of right shoulder 01/06/2013  . KNEE, ARTHRITIS, DEGEN./OSTEO 06/02/2009  . DERANGEMENT MENISCUS 06/02/2009  . KNEE PAIN 06/02/2009   Past Medical History:  Diagnosis Date  . Hypercholesteremia   . Hypertension   . Kidney donor   . Numbness    feet    Family History  Problem Relation Age of Onset  . Heart disease Mother   . Stroke Father     Past Surgical History:  Procedure Laterality Date  . BACK SURGERY  1996  . COLONOSCOPY N/A 07/08/2014   Procedure: COLONOSCOPY;  Surgeon: [name], MD;  [facility];  Service: Endoscopy;  Laterality: N/A;  1030 - moved to 11:55 - Ann to notify pt  . NEPHRECTOMY  2003   Left   Social History   Occupational History  . Occupation: Retired  Tobacco Use  . Smoking status: Former Smoker    Last attempt to quit: 2002    Years since quitting: 18.0  . Smokeless tobacco: Never Used  Substance and Sexual Activity  . Alcohol use: Yes    Comment: 1 beer per week  . Drug use: Never  . Sexual activity: Not on file

## 2018-05-23 ENCOUNTER — Ambulatory Visit (HOSPITAL_COMMUNITY)
Admission: RE | Admit: 2018-05-23 | Discharge: 2018-05-23 | Disposition: A | Discharge status: Home / Self Care | Payer: Medicare Other | Source: Ambulatory Visit | Attending: Orthopaedic Surgery | Admitting: Orthopaedic Surgery

## 2018-05-23 DIAGNOSIS — M25561 Pain in right knee: Secondary | ICD-10-CM | POA: Diagnosis not present

## 2018-05-29 ENCOUNTER — Encounter (INDEPENDENT_AMBULATORY_CARE_PROVIDER_SITE_OTHER): Payer: Self-pay | Admitting: Orthopaedic Surgery

## 2018-05-29 ENCOUNTER — Ambulatory Visit (INDEPENDENT_AMBULATORY_CARE_PROVIDER_SITE_OTHER): Payer: Medicare Other | Admitting: Orthopaedic Surgery

## 2018-05-29 VITALS — BP 121/74 | HR 65 | Ht 71.0 in | Wt 217.0 lb

## 2018-05-29 DIAGNOSIS — M170 Bilateral primary osteoarthritis of knee: Secondary | ICD-10-CM | POA: Diagnosis not present

## 2018-05-29 DIAGNOSIS — S83241D Other tear of medial meniscus, current injury, right knee, subsequent encounter: Secondary | ICD-10-CM

## 2018-05-29 NOTE — Progress Notes (Signed)
Office Visit Note   Patient: Timothy Schroeder           Date of Birth: March 12, 1946           MRN: [id number] Visit Date: 05/29/2018              Requested by: [name], MD [address] [place], [place] [address] PCP: [name], MD   Assessment & Plan: Visit Diagnoses:  1. Bilateral primary osteoarthritis of knee   2. Tear of medial meniscus of right knee, current, unspecified tear type, subsequent encounter     Plan: MRI scan of right knee demonstrates the complete radial tear to the root of the posterior horn of the medial meniscus.  There is a 9 mm loose body anteriorly and tricompartmental osteoarthritis [name] has done well after the cortisone injection several weeks ago.  Presently he is not having "that much pain".  Long discussion regarding treatment options including waiting to see if his pain recurrence versus arthroscopic debridement.  Joint spaces are well maintained by plain films.  He will let us know  Follow-Up Instructions: Return if symptoms worsen or fail to improve.   Orders:  No orders of the defined types were placed in this encounter.  No orders of the defined types were placed in this encounter.     Procedures: No procedures performed   Clinical Data: No additional findings.   Subjective: Chief Complaint  Patient presents with  . Right Knee - Follow-up    MRI Right Knee Review  Patient returns to review MRI Right Knee. He did have previous cortisone injection on 04/25/2018.  Feeling better with minimal symptoms after the cortisone injection.  Occasionally has a feeling of something loose in his knee.  HPI  Review of Systems   Objective: Vital Signs: BP 121/74   Pulse 65   Ht 5\' 11"  (1.803 m)   Wt 217 lb (98.4 kg)   BMI 30.27 kg/m   Physical Exam Constitutional:      Appearance: He is well-developed.  Eyes:     Pupils: Pupils are equal, round, and reactive to light.  Pulmonary:     Effort: Pulmonary effort is normal.   Skin:    General: Skin is warm and dry.  Neurological:     Mental Status: He is alert and oriented to person, place, and time.  Psychiatric:        Behavior: Behavior normal.     Ortho Exam knee without effusion.  Full extension and flexion over 105 degrees.  No instability.  No significant medial lateral joint pain.  Positive patellar crepitation but without any discomfort.  Pain specifically along the posterior horn of the medial meniscus.  No calf pain.  No distal edema.  Specialty Comments:  No specialty comments available.  Imaging: No results found.   PMFS History: Patient Active Problem List   Diagnosis Date Noted  . Bilateral primary osteoarthritis of knee 05/29/2018  . Peripheral neuropathy 01/30/2018  . Rotator cuff syndrome of right shoulder 01/14/2013  . Partial tear of right rotator cuff 01/06/2013  . Acute pain of right shoulder 01/06/2013  . KNEE, ARTHRITIS, DEGEN./OSTEO 06/02/2009  . Tear of medial meniscus of knee, current 06/02/2009  . KNEE PAIN 06/02/2009   Past Medical History:  Diagnosis Date  . Hypercholesteremia   . Hypertension   . Kidney donor   . Numbness    feet    Family History  Problem Relation Age of  Onset  . Heart disease Mother   . Stroke Father     Past Surgical History:  Procedure Laterality Date  . BACK SURGERY  1996  . COLONOSCOPY N/A 07/08/2014   Procedure: COLONOSCOPY;  Surgeon: [name], MD;  [facility];  Service: Endoscopy;  Laterality: N/A;  1030 - moved to 11:55 - Ann to notify pt  . NEPHRECTOMY  2003   Left   Social History   Occupational History  . Occupation: Retired  Tobacco Use  . Smoking status: Former Smoker    Last attempt to quit: 2002    Years since quitting: 18.1  . Smokeless tobacco: Never Used  Substance and Sexual Activity  . Alcohol use: Yes    Comment: 1 beer per week  . Drug use: Never  . Sexual activity: Not on file

## 2018-06-04 DIAGNOSIS — R944 Abnormal results of kidney function studies: Secondary | ICD-10-CM | POA: Diagnosis not present

## 2018-06-04 DIAGNOSIS — I10 Essential (primary) hypertension: Secondary | ICD-10-CM | POA: Diagnosis not present

## 2018-06-04 DIAGNOSIS — E782 Mixed hyperlipidemia: Secondary | ICD-10-CM | POA: Diagnosis not present

## 2018-06-04 DIAGNOSIS — Z6832 Body mass index (BMI) 32.0-32.9, adult: Secondary | ICD-10-CM | POA: Diagnosis not present

## 2018-06-11 DIAGNOSIS — M25519 Pain in unspecified shoulder: Secondary | ICD-10-CM | POA: Diagnosis not present

## 2018-06-11 DIAGNOSIS — Z0000 Encounter for general adult medical examination without abnormal findings: Secondary | ICD-10-CM | POA: Diagnosis not present

## 2018-06-11 DIAGNOSIS — I10 Essential (primary) hypertension: Secondary | ICD-10-CM | POA: Diagnosis not present

## 2018-06-11 DIAGNOSIS — N400 Enlarged prostate without lower urinary tract symptoms: Secondary | ICD-10-CM | POA: Diagnosis not present

## 2018-06-11 DIAGNOSIS — M6530 Trigger finger, unspecified finger: Secondary | ICD-10-CM | POA: Diagnosis not present

## 2018-06-11 DIAGNOSIS — M2550 Pain in unspecified joint: Secondary | ICD-10-CM | POA: Diagnosis not present

## 2018-06-11 DIAGNOSIS — E6609 Other obesity due to excess calories: Secondary | ICD-10-CM | POA: Diagnosis not present

## 2018-06-11 DIAGNOSIS — G9009 Other idiopathic peripheral autonomic neuropathy: Secondary | ICD-10-CM | POA: Diagnosis not present

## 2018-06-11 DIAGNOSIS — E782 Mixed hyperlipidemia: Secondary | ICD-10-CM | POA: Diagnosis not present

## 2018-06-11 DIAGNOSIS — R944 Abnormal results of kidney function studies: Secondary | ICD-10-CM | POA: Diagnosis not present

## 2018-06-11 DIAGNOSIS — G4730 Sleep apnea, unspecified: Secondary | ICD-10-CM | POA: Diagnosis not present

## 2018-06-11 DIAGNOSIS — S83241D Other tear of medial meniscus, current injury, right knee, subsequent encounter: Secondary | ICD-10-CM | POA: Diagnosis not present

## 2018-06-11 DIAGNOSIS — Z524 Kidney donor: Secondary | ICD-10-CM | POA: Diagnosis not present

## 2018-11-26 DIAGNOSIS — R944 Abnormal results of kidney function studies: Secondary | ICD-10-CM | POA: Diagnosis not present

## 2018-11-26 DIAGNOSIS — Z1329 Encounter for screening for other suspected endocrine disorder: Secondary | ICD-10-CM | POA: Diagnosis not present

## 2018-11-26 DIAGNOSIS — I10 Essential (primary) hypertension: Secondary | ICD-10-CM | POA: Diagnosis not present

## 2018-11-26 DIAGNOSIS — Z125 Encounter for screening for malignant neoplasm of prostate: Secondary | ICD-10-CM | POA: Diagnosis not present

## 2018-11-26 DIAGNOSIS — Z1321 Encounter for screening for nutritional disorder: Secondary | ICD-10-CM | POA: Diagnosis not present

## 2018-11-26 DIAGNOSIS — E782 Mixed hyperlipidemia: Secondary | ICD-10-CM | POA: Diagnosis not present

## 2018-11-26 DIAGNOSIS — N400 Enlarged prostate without lower urinary tract symptoms: Secondary | ICD-10-CM | POA: Diagnosis not present

## 2018-12-03 DIAGNOSIS — M2550 Pain in unspecified joint: Secondary | ICD-10-CM | POA: Diagnosis not present

## 2018-12-03 DIAGNOSIS — M6530 Trigger finger, unspecified finger: Secondary | ICD-10-CM | POA: Diagnosis not present

## 2018-12-03 DIAGNOSIS — R944 Abnormal results of kidney function studies: Secondary | ICD-10-CM | POA: Diagnosis not present

## 2018-12-03 DIAGNOSIS — E6609 Other obesity due to excess calories: Secondary | ICD-10-CM | POA: Diagnosis not present

## 2018-12-03 DIAGNOSIS — G9009 Other idiopathic peripheral autonomic neuropathy: Secondary | ICD-10-CM | POA: Diagnosis not present

## 2018-12-03 DIAGNOSIS — S83241D Other tear of medial meniscus, current injury, right knee, subsequent encounter: Secondary | ICD-10-CM | POA: Diagnosis not present

## 2018-12-03 DIAGNOSIS — M25519 Pain in unspecified shoulder: Secondary | ICD-10-CM | POA: Diagnosis not present

## 2018-12-03 DIAGNOSIS — N400 Enlarged prostate without lower urinary tract symptoms: Secondary | ICD-10-CM | POA: Diagnosis not present

## 2018-12-03 DIAGNOSIS — Z524 Kidney donor: Secondary | ICD-10-CM | POA: Diagnosis not present

## 2018-12-03 DIAGNOSIS — E782 Mixed hyperlipidemia: Secondary | ICD-10-CM | POA: Diagnosis not present

## 2018-12-03 DIAGNOSIS — I10 Essential (primary) hypertension: Secondary | ICD-10-CM | POA: Diagnosis not present

## 2018-12-03 DIAGNOSIS — G4730 Sleep apnea, unspecified: Secondary | ICD-10-CM | POA: Diagnosis not present

## 2019-01-31 DIAGNOSIS — Z23 Encounter for immunization: Secondary | ICD-10-CM | POA: Diagnosis not present

## 2019-06-04 DIAGNOSIS — Z1329 Encounter for screening for other suspected endocrine disorder: Secondary | ICD-10-CM | POA: Diagnosis not present

## 2019-06-04 DIAGNOSIS — D519 Vitamin B12 deficiency anemia, unspecified: Secondary | ICD-10-CM | POA: Diagnosis not present

## 2019-06-04 DIAGNOSIS — E782 Mixed hyperlipidemia: Secondary | ICD-10-CM | POA: Diagnosis not present

## 2019-06-04 DIAGNOSIS — I10 Essential (primary) hypertension: Secondary | ICD-10-CM | POA: Diagnosis not present

## 2019-06-06 DIAGNOSIS — Z23 Encounter for immunization: Secondary | ICD-10-CM | POA: Diagnosis not present

## 2019-06-10 DIAGNOSIS — M6530 Trigger finger, unspecified finger: Secondary | ICD-10-CM | POA: Diagnosis not present

## 2019-06-10 DIAGNOSIS — G9009 Other idiopathic peripheral autonomic neuropathy: Secondary | ICD-10-CM | POA: Diagnosis not present

## 2019-06-10 DIAGNOSIS — E6609 Other obesity due to excess calories: Secondary | ICD-10-CM | POA: Diagnosis not present

## 2019-06-10 DIAGNOSIS — G4730 Sleep apnea, unspecified: Secondary | ICD-10-CM | POA: Diagnosis not present

## 2019-06-10 DIAGNOSIS — E782 Mixed hyperlipidemia: Secondary | ICD-10-CM | POA: Diagnosis not present

## 2019-06-10 DIAGNOSIS — N400 Enlarged prostate without lower urinary tract symptoms: Secondary | ICD-10-CM | POA: Diagnosis not present

## 2019-06-10 DIAGNOSIS — R944 Abnormal results of kidney function studies: Secondary | ICD-10-CM | POA: Diagnosis not present

## 2019-06-10 DIAGNOSIS — M2550 Pain in unspecified joint: Secondary | ICD-10-CM | POA: Diagnosis not present

## 2019-06-10 DIAGNOSIS — I10 Essential (primary) hypertension: Secondary | ICD-10-CM | POA: Diagnosis not present

## 2019-06-10 DIAGNOSIS — M25519 Pain in unspecified shoulder: Secondary | ICD-10-CM | POA: Diagnosis not present

## 2019-06-10 DIAGNOSIS — S83241D Other tear of medial meniscus, current injury, right knee, subsequent encounter: Secondary | ICD-10-CM | POA: Diagnosis not present

## 2019-06-10 DIAGNOSIS — Z524 Kidney donor: Secondary | ICD-10-CM | POA: Diagnosis not present

## 2019-07-04 DIAGNOSIS — Z23 Encounter for immunization: Secondary | ICD-10-CM | POA: Diagnosis not present

## 2019-12-12 DIAGNOSIS — Z6832 Body mass index (BMI) 32.0-32.9, adult: Secondary | ICD-10-CM | POA: Diagnosis not present

## 2019-12-12 DIAGNOSIS — M25519 Pain in unspecified shoulder: Secondary | ICD-10-CM | POA: Diagnosis not present

## 2019-12-12 DIAGNOSIS — Z6831 Body mass index (BMI) 31.0-31.9, adult: Secondary | ICD-10-CM | POA: Diagnosis not present

## 2019-12-12 DIAGNOSIS — G4730 Sleep apnea, unspecified: Secondary | ICD-10-CM | POA: Diagnosis not present

## 2019-12-12 DIAGNOSIS — S83241D Other tear of medial meniscus, current injury, right knee, subsequent encounter: Secondary | ICD-10-CM | POA: Diagnosis not present

## 2019-12-12 DIAGNOSIS — R944 Abnormal results of kidney function studies: Secondary | ICD-10-CM | POA: Diagnosis not present

## 2019-12-12 DIAGNOSIS — I10 Essential (primary) hypertension: Secondary | ICD-10-CM | POA: Diagnosis not present

## 2019-12-12 DIAGNOSIS — D519 Vitamin B12 deficiency anemia, unspecified: Secondary | ICD-10-CM | POA: Diagnosis not present

## 2019-12-12 DIAGNOSIS — N400 Enlarged prostate without lower urinary tract symptoms: Secondary | ICD-10-CM | POA: Diagnosis not present

## 2019-12-12 DIAGNOSIS — M2550 Pain in unspecified joint: Secondary | ICD-10-CM | POA: Diagnosis not present

## 2019-12-12 DIAGNOSIS — E6609 Other obesity due to excess calories: Secondary | ICD-10-CM | POA: Diagnosis not present

## 2019-12-12 DIAGNOSIS — G9009 Other idiopathic peripheral autonomic neuropathy: Secondary | ICD-10-CM | POA: Diagnosis not present

## 2019-12-22 DIAGNOSIS — E6609 Other obesity due to excess calories: Secondary | ICD-10-CM | POA: Diagnosis not present

## 2019-12-22 DIAGNOSIS — I10 Essential (primary) hypertension: Secondary | ICD-10-CM | POA: Diagnosis not present

## 2019-12-22 DIAGNOSIS — S83241D Other tear of medial meniscus, current injury, right knee, subsequent encounter: Secondary | ICD-10-CM | POA: Diagnosis not present

## 2019-12-22 DIAGNOSIS — M25519 Pain in unspecified shoulder: Secondary | ICD-10-CM | POA: Diagnosis not present

## 2019-12-22 DIAGNOSIS — M2550 Pain in unspecified joint: Secondary | ICD-10-CM | POA: Diagnosis not present

## 2019-12-22 DIAGNOSIS — M6530 Trigger finger, unspecified finger: Secondary | ICD-10-CM | POA: Diagnosis not present

## 2019-12-22 DIAGNOSIS — Z524 Kidney donor: Secondary | ICD-10-CM | POA: Diagnosis not present

## 2019-12-22 DIAGNOSIS — G9009 Other idiopathic peripheral autonomic neuropathy: Secondary | ICD-10-CM | POA: Diagnosis not present

## 2019-12-22 DIAGNOSIS — N400 Enlarged prostate without lower urinary tract symptoms: Secondary | ICD-10-CM | POA: Diagnosis not present

## 2019-12-22 DIAGNOSIS — Z0001 Encounter for general adult medical examination with abnormal findings: Secondary | ICD-10-CM | POA: Diagnosis not present

## 2019-12-22 DIAGNOSIS — R944 Abnormal results of kidney function studies: Secondary | ICD-10-CM | POA: Diagnosis not present

## 2019-12-22 DIAGNOSIS — G4730 Sleep apnea, unspecified: Secondary | ICD-10-CM | POA: Diagnosis not present

## 2019-12-22 DIAGNOSIS — E782 Mixed hyperlipidemia: Secondary | ICD-10-CM | POA: Diagnosis not present

## 2020-01-22 DIAGNOSIS — M10079 Idiopathic gout, unspecified ankle and foot: Secondary | ICD-10-CM | POA: Diagnosis not present

## 2020-01-22 DIAGNOSIS — M25571 Pain in right ankle and joints of right foot: Secondary | ICD-10-CM | POA: Diagnosis not present

## 2020-01-22 DIAGNOSIS — R2241 Localized swelling, mass and lump, right lower limb: Secondary | ICD-10-CM | POA: Diagnosis not present

## 2020-01-27 DIAGNOSIS — M1A0710 Idiopathic chronic gout, right ankle and foot, without tophus (tophi): Secondary | ICD-10-CM | POA: Diagnosis not present

## 2020-01-27 DIAGNOSIS — Z23 Encounter for immunization: Secondary | ICD-10-CM | POA: Diagnosis not present

## 2020-02-16 DIAGNOSIS — Z23 Encounter for immunization: Secondary | ICD-10-CM | POA: Diagnosis not present

## 2020-06-21 DIAGNOSIS — Z6833 Body mass index (BMI) 33.0-33.9, adult: Secondary | ICD-10-CM | POA: Diagnosis not present

## 2020-06-21 DIAGNOSIS — M25519 Pain in unspecified shoulder: Secondary | ICD-10-CM | POA: Diagnosis not present

## 2020-06-21 DIAGNOSIS — R944 Abnormal results of kidney function studies: Secondary | ICD-10-CM | POA: Diagnosis not present

## 2020-06-21 DIAGNOSIS — I10 Essential (primary) hypertension: Secondary | ICD-10-CM | POA: Diagnosis not present

## 2020-06-21 DIAGNOSIS — R0782 Intercostal pain: Secondary | ICD-10-CM | POA: Diagnosis not present

## 2020-06-21 DIAGNOSIS — Z6831 Body mass index (BMI) 31.0-31.9, adult: Secondary | ICD-10-CM | POA: Diagnosis not present

## 2020-06-21 DIAGNOSIS — S83241D Other tear of medial meniscus, current injury, right knee, subsequent encounter: Secondary | ICD-10-CM | POA: Diagnosis not present

## 2020-06-21 DIAGNOSIS — M1A0710 Idiopathic chronic gout, right ankle and foot, without tophus (tophi): Secondary | ICD-10-CM | POA: Diagnosis not present

## 2020-06-21 DIAGNOSIS — N400 Enlarged prostate without lower urinary tract symptoms: Secondary | ICD-10-CM | POA: Diagnosis not present

## 2020-06-21 DIAGNOSIS — M2550 Pain in unspecified joint: Secondary | ICD-10-CM | POA: Diagnosis not present

## 2020-06-21 DIAGNOSIS — E6609 Other obesity due to excess calories: Secondary | ICD-10-CM | POA: Diagnosis not present

## 2020-06-21 DIAGNOSIS — G9009 Other idiopathic peripheral autonomic neuropathy: Secondary | ICD-10-CM | POA: Diagnosis not present

## 2020-06-24 DIAGNOSIS — R944 Abnormal results of kidney function studies: Secondary | ICD-10-CM | POA: Diagnosis not present

## 2020-06-24 DIAGNOSIS — S83241D Other tear of medial meniscus, current injury, right knee, subsequent encounter: Secondary | ICD-10-CM | POA: Diagnosis not present

## 2020-06-24 DIAGNOSIS — M25519 Pain in unspecified shoulder: Secondary | ICD-10-CM | POA: Diagnosis not present

## 2020-06-24 DIAGNOSIS — M6530 Trigger finger, unspecified finger: Secondary | ICD-10-CM | POA: Diagnosis not present

## 2020-06-24 DIAGNOSIS — I10 Essential (primary) hypertension: Secondary | ICD-10-CM | POA: Diagnosis not present

## 2020-06-24 DIAGNOSIS — G4730 Sleep apnea, unspecified: Secondary | ICD-10-CM | POA: Diagnosis not present

## 2020-06-24 DIAGNOSIS — M2550 Pain in unspecified joint: Secondary | ICD-10-CM | POA: Diagnosis not present

## 2020-06-24 DIAGNOSIS — E6609 Other obesity due to excess calories: Secondary | ICD-10-CM | POA: Diagnosis not present

## 2020-06-24 DIAGNOSIS — D519 Vitamin B12 deficiency anemia, unspecified: Secondary | ICD-10-CM | POA: Diagnosis not present

## 2020-06-24 DIAGNOSIS — Z6833 Body mass index (BMI) 33.0-33.9, adult: Secondary | ICD-10-CM | POA: Diagnosis not present

## 2020-06-24 DIAGNOSIS — Z524 Kidney donor: Secondary | ICD-10-CM | POA: Diagnosis not present

## 2020-06-24 DIAGNOSIS — E782 Mixed hyperlipidemia: Secondary | ICD-10-CM | POA: Diagnosis not present

## 2020-06-28 ENCOUNTER — Other Ambulatory Visit: Payer: Self-pay | Admitting: Urology

## 2020-08-26 ENCOUNTER — Encounter (HOSPITAL_BASED_OUTPATIENT_CLINIC_OR_DEPARTMENT_OTHER): Payer: Self-pay

## 2020-08-26 ENCOUNTER — Ambulatory Visit (HOSPITAL_BASED_OUTPATIENT_CLINIC_OR_DEPARTMENT_OTHER): Admit: 2020-08-26 | Payer: Medicare Other | Admitting: Urology

## 2020-08-26 SURGERY — INSERTION, RADIATION SOURCE, PROSTATE
Anesthesia: General

## 2020-09-28 DIAGNOSIS — Z23 Encounter for immunization: Secondary | ICD-10-CM | POA: Diagnosis not present

## 2020-10-26 DIAGNOSIS — Z20822 Contact with and (suspected) exposure to covid-19: Secondary | ICD-10-CM | POA: Diagnosis not present

## 2020-12-28 DIAGNOSIS — R7301 Impaired fasting glucose: Secondary | ICD-10-CM | POA: Diagnosis not present

## 2020-12-28 DIAGNOSIS — I10 Essential (primary) hypertension: Secondary | ICD-10-CM | POA: Diagnosis not present

## 2020-12-28 DIAGNOSIS — Z0000 Encounter for general adult medical examination without abnormal findings: Secondary | ICD-10-CM | POA: Diagnosis not present

## 2020-12-28 DIAGNOSIS — E782 Mixed hyperlipidemia: Secondary | ICD-10-CM | POA: Diagnosis not present

## 2020-12-30 DIAGNOSIS — R944 Abnormal results of kidney function studies: Secondary | ICD-10-CM | POA: Diagnosis not present

## 2020-12-30 DIAGNOSIS — M6530 Trigger finger, unspecified finger: Secondary | ICD-10-CM | POA: Diagnosis not present

## 2020-12-30 DIAGNOSIS — I10 Essential (primary) hypertension: Secondary | ICD-10-CM | POA: Diagnosis not present

## 2020-12-30 DIAGNOSIS — Z0001 Encounter for general adult medical examination with abnormal findings: Secondary | ICD-10-CM | POA: Diagnosis not present

## 2020-12-30 DIAGNOSIS — M2550 Pain in unspecified joint: Secondary | ICD-10-CM | POA: Diagnosis not present

## 2020-12-30 DIAGNOSIS — S83207S Unspecified tear of unspecified meniscus, current injury, left knee, sequela: Secondary | ICD-10-CM | POA: Diagnosis not present

## 2020-12-30 DIAGNOSIS — E782 Mixed hyperlipidemia: Secondary | ICD-10-CM | POA: Diagnosis not present

## 2020-12-30 DIAGNOSIS — G4733 Obstructive sleep apnea (adult) (pediatric): Secondary | ICD-10-CM | POA: Diagnosis not present

## 2020-12-30 DIAGNOSIS — G609 Hereditary and idiopathic neuropathy, unspecified: Secondary | ICD-10-CM | POA: Diagnosis not present

## 2020-12-30 DIAGNOSIS — E538 Deficiency of other specified B group vitamins: Secondary | ICD-10-CM | POA: Diagnosis not present

## 2020-12-30 DIAGNOSIS — R7301 Impaired fasting glucose: Secondary | ICD-10-CM | POA: Diagnosis not present

## 2020-12-30 DIAGNOSIS — N400 Enlarged prostate without lower urinary tract symptoms: Secondary | ICD-10-CM | POA: Diagnosis not present

## 2021-01-31 DIAGNOSIS — Z23 Encounter for immunization: Secondary | ICD-10-CM | POA: Diagnosis not present

## 2021-02-02 ENCOUNTER — Encounter: Payer: Self-pay | Admitting: Orthopaedic Surgery

## 2021-02-02 ENCOUNTER — Ambulatory Visit (INDEPENDENT_AMBULATORY_CARE_PROVIDER_SITE_OTHER): Payer: Medicare Other | Admitting: Orthopaedic Surgery

## 2021-02-02 ENCOUNTER — Other Ambulatory Visit: Payer: Self-pay

## 2021-02-02 ENCOUNTER — Ambulatory Visit: Payer: Self-pay

## 2021-02-02 DIAGNOSIS — M25511 Pain in right shoulder: Secondary | ICD-10-CM | POA: Diagnosis not present

## 2021-02-02 MED ORDER — METHYLPREDNISOLONE ACETATE 40 MG/ML IJ SUSP
80.0000 mg | INTRAMUSCULAR | Status: AC | PRN
  Administered 2021-02-02: 80 mg via INTRA_ARTICULAR

## 2021-02-02 MED ORDER — LIDOCAINE HCL 1 % IJ SOLN
2.0000 mL | INTRAMUSCULAR | Status: AC | PRN
  Administered 2021-02-02: 2 mL

## 2021-02-02 NOTE — Progress Notes (Signed)
Office Visit Note   Patient: Timothy Schroeder           Date of Birth: [birth year]           MRN: [id number] Visit Date: 02/02/2021              Requested by: [name], MD [place],  [place] [address] PCP: [name], MD   Assessment & Plan: Visit Diagnoses:  1. Acute pain of right shoulder     Plan: Right shoulder pain after pulling on a pressure washer and moving it.  Findings most consistent with impingement of the right shoulder especially given the x-ray and his exam which was positive for impingement findings.  Would recommend an injection today and he would like to go forward with this.  He understands that if he does not have good relief we would recommend an MRI of the shoulder to evaluate the rotator cuff further.  Follow-Up Instructions: No follow-ups on file.   Orders:  Orders Placed This Encounter  Procedures   XR Shoulder Right   No orders of the defined types were placed in this encounter.     Procedures: Large Joint Inj: R subacromial bursa on 02/02/2021 12:29 PM Indications: diagnostic evaluation and pain Details: 25 G 1.5 in needle, anterior approach  Arthrogram: No  Medications: 2 mL lidocaine 1 %; 80 mg methylPREDNISolone acetate 40 MG/ML Outcome: tolerated well, no immediate complications Procedure, treatment alternatives, risks and benefits explained, specific risks discussed. Consent was given by the patient.   34ml marcaine 60ml lidocaine 61ml methylprednisolone   Clinical Data: No additional findings.   Subjective: Chief Complaint  Patient presents with   Right Shoulder - Pain  Patient presents today for right shoulder pain. He said that the was loading a pressure washer two weeks ago and "pulled" his shoulder. His pain is located anteriorly when lifting his arm. No pain with rest. He said that it has not improved over the last two weeks. He is taking Aleve if needed. He is right hand dominant. No previous right shoulder  surgery.  Denies any numbness.  HPI  Review of Systems  All other systems reviewed and are negative.   Objective: Vital Signs: There were no vitals taken for this visit.  Physical Exam Constitutional:      Appearance: Normal appearance.  Pulmonary:     Effort: Pulmonary effort is normal.  Skin:    General: Skin is warm and dry.  Neurological:     General: No focal deficit present.     Mental Status: He is alert.  Psychiatric:        Mood and Affect: Mood normal.    Ortho Exam Examination of his right shoulder demonstrates no obvious clinical abnormalities.  He is able to do forward elevation to 170 degrees equivalent to the unaffected side though this is somewhat painful.  He can do internal rotation behind his back to about his beltline while this is painful he he has able to do liftoff.  He has no pain with crossed arm abduction.  He has a negative speeds test.  He has a positive Neer impingement test as well as a positive empty can test. Specialty Comments:  No specialty comments available.  Imaging: No results found.   PMFS History: Patient Active Problem List   Diagnosis Date Noted   Bilateral primary osteoarthritis of knee 05/29/2018   Peripheral neuropathy 01/30/2018   Rotator cuff syndrome of right shoulder  01/14/2013   Partial tear of right rotator cuff 01/06/2013   Acute pain of right shoulder 01/06/2013   KNEE, ARTHRITIS, DEGEN./OSTEO 06/02/2009   Tear of medial meniscus of knee, current 06/02/2009   KNEE PAIN 06/02/2009   Past Medical History:  Diagnosis Date   Hypercholesteremia    Hypertension    Kidney donor    Numbness    feet    Family History  Problem Relation Age of Onset   Heart disease Mother    Stroke Father     Past Surgical History:  Procedure Laterality Date   [place]   COLONOSCOPY N/A 07/08/2014   Procedure: COLONOSCOPY;  Surgeon: [name], MD;  [facility];  Service: Endoscopy;  Laterality: N/A;   1030 - moved to 11:55 - Ann to notify pt   NEPHRECTOMY  2003   Left   Social History   Occupational History   Occupation: Retired  Tobacco Use   Smoking status: Former    Types: Cigarettes    Quit date: 2002    Years since quitting: 20.7   Smokeless tobacco: Never  Vaping Use   Vaping Use: Never used  Substance and Sexual Activity   Alcohol use: Yes    Comment: 1 beer per week   Drug use: Never   Sexual activity: Not on file

## 2021-02-23 ENCOUNTER — Ambulatory Visit (INDEPENDENT_AMBULATORY_CARE_PROVIDER_SITE_OTHER): Payer: Medicare Other | Admitting: Orthopaedic Surgery

## 2021-02-23 ENCOUNTER — Other Ambulatory Visit: Payer: Self-pay

## 2021-02-23 ENCOUNTER — Ambulatory Visit: Payer: Self-pay

## 2021-02-23 ENCOUNTER — Encounter: Payer: Self-pay | Admitting: Orthopaedic Surgery

## 2021-02-23 DIAGNOSIS — M79621 Pain in right upper arm: Secondary | ICD-10-CM | POA: Diagnosis not present

## 2021-02-23 DIAGNOSIS — M25562 Pain in left knee: Secondary | ICD-10-CM | POA: Diagnosis not present

## 2021-02-23 NOTE — Progress Notes (Signed)
Office Visit Note   Patient: Timothy Schroeder           Date of Birth: 1945-11-11           MRN: [id number] Visit Date: 02/23/2021              Requested by: [name], MD [address] [place],  [place] [address] PCP: [name], MD   Assessment & Plan: Visit Diagnoses: No diagnosis found.  Plan:  Patient with a history of a right subacromial injection into his shoulder 2 weeks ago doing much better and over 80% relief with the injection.  However he was outside doing some lawn work and had immediate pain in his proximal shoulder and arm.  On the right.  Findings classic for ruptured proximal biceps tendon.  Discussed with him that as long as his shoulder remains asymptomatic that no thing further needs to be done.  However if he does have shoulder pain certainly there is a association of biceps tendon rupture and rotator cuff pathology.  If this is so he will call the office and we will order an MRI of the right shoulder.  Osteoarthritis of the left knee however he is asymptomatic today.  Can follow-up for a steroid injection if this becomes significantly painful Follow-Up Instructions: No follow-ups on file.   Orders:  No orders of the defined types were placed in this encounter.  No orders of the defined types were placed in this encounter.     Procedures: No procedures performed   Clinical Data: No additional findings.   Subjective: No chief complaint on file. Patient presents today for two issues. The first of which is his right upper arm. He states that he was mowing his lawn two weeks ago and reached down to grab a stick. He tossed it to the side and felt something rip in his upper right arm. He said that the pain has improved, but now has a popeye arm. He is right hand dominant. The second issue is his left knee. He was blowing leaves last Wednesday and the following day woke with pain in his left knee. He states that it feels "tight to bend". Swelling has improved  some. Wearing a brace does help.     Review of Systems  All other systems reviewed and are negative.   Objective: Vital Signs: There were no vitals taken for this visit.  Physical Exam Constitutional:      Appearance: Normal appearance.  HENT:     Head: Atraumatic.  Pulmonary:     Effort: Pulmonary effort is normal.  Neurological:     Mental Status: He is alert.    Ortho Exam Examination of his right shoulder demonstrates no bruising he is mildly tender over the proximal insertion of the biceps.  He has significantly improved and has forward extension without pain internal rotation behind his back resisted abduction impingement findings are significantly less.  The right arm demonstrates bruising and "Popeye appearance of his bicep.  He is able to bend and extend his arm without difficulty.  Distal pulses are intact.  He is able to move his fingers without difficulty good distal strength. Specialty Comments:  No specialty comments available.  Imaging: No results found.   PMFS History: Patient Active Problem List   Diagnosis Date Noted   Bilateral primary osteoarthritis of knee 05/29/2018   Peripheral neuropathy 01/30/2018   Rotator cuff syndrome of right shoulder 01/14/2013   Partial tear of  right rotator cuff 01/06/2013   Acute pain of right shoulder 01/06/2013   KNEE, ARTHRITIS, DEGEN./OSTEO 06/02/2009   Tear of medial meniscus of knee, current 06/02/2009   KNEE PAIN 06/02/2009   Past Medical History:  Diagnosis Date   Hypercholesteremia    Hypertension    Kidney donor    Numbness    feet    Family History  Problem Relation Age of Onset   Heart disease Mother    Stroke Father     Past Surgical History:  Procedure Laterality Date   BACK SURGERY  1996   COLONOSCOPY N/A 07/08/2014   Procedure: COLONOSCOPY;  Surgeon: [name], MD;  Location: [facility];  Service: Endoscopy;  Laterality: N/A;  1030 - moved to 11:55 - Ann to notify pt   NEPHRECTOMY   2003   Left   Social History   Occupational History   Occupation: Retired  Tobacco Use   Smoking status: Former    Types: Cigarettes    Quit date: 2002    Years since quitting: 20.8   Smokeless tobacco: Never  Vaping Use   Vaping Use: Never used  Substance and Sexual Activity   Alcohol use: Yes    Comment: 1 beer per week   Drug use: Never   Sexual activity: Not on file

## 2021-05-01 DIAGNOSIS — Z20822 Contact with and (suspected) exposure to covid-19: Secondary | ICD-10-CM | POA: Diagnosis not present

## 2021-06-15 ENCOUNTER — Encounter (INDEPENDENT_AMBULATORY_CARE_PROVIDER_SITE_OTHER): Payer: Self-pay | Admitting: *Deleted

## 2021-06-23 DIAGNOSIS — E538 Deficiency of other specified B group vitamins: Secondary | ICD-10-CM | POA: Diagnosis not present

## 2021-06-23 DIAGNOSIS — N400 Enlarged prostate without lower urinary tract symptoms: Secondary | ICD-10-CM | POA: Diagnosis not present

## 2021-06-23 DIAGNOSIS — E782 Mixed hyperlipidemia: Secondary | ICD-10-CM | POA: Diagnosis not present

## 2021-06-23 DIAGNOSIS — R7301 Impaired fasting glucose: Secondary | ICD-10-CM | POA: Diagnosis not present

## 2021-06-30 DIAGNOSIS — M6530 Trigger finger, unspecified finger: Secondary | ICD-10-CM | POA: Diagnosis not present

## 2021-06-30 DIAGNOSIS — G4733 Obstructive sleep apnea (adult) (pediatric): Secondary | ICD-10-CM | POA: Diagnosis not present

## 2021-06-30 DIAGNOSIS — E782 Mixed hyperlipidemia: Secondary | ICD-10-CM | POA: Diagnosis not present

## 2021-06-30 DIAGNOSIS — N400 Enlarged prostate without lower urinary tract symptoms: Secondary | ICD-10-CM | POA: Diagnosis not present

## 2021-06-30 DIAGNOSIS — G609 Hereditary and idiopathic neuropathy, unspecified: Secondary | ICD-10-CM | POA: Diagnosis not present

## 2021-06-30 DIAGNOSIS — E669 Obesity, unspecified: Secondary | ICD-10-CM | POA: Diagnosis not present

## 2021-06-30 DIAGNOSIS — E538 Deficiency of other specified B group vitamins: Secondary | ICD-10-CM | POA: Diagnosis not present

## 2021-06-30 DIAGNOSIS — I10 Essential (primary) hypertension: Secondary | ICD-10-CM | POA: Diagnosis not present

## 2021-06-30 DIAGNOSIS — S83207S Unspecified tear of unspecified meniscus, current injury, left knee, sequela: Secondary | ICD-10-CM | POA: Diagnosis not present

## 2021-06-30 DIAGNOSIS — R944 Abnormal results of kidney function studies: Secondary | ICD-10-CM | POA: Diagnosis not present

## 2021-06-30 DIAGNOSIS — M2550 Pain in unspecified joint: Secondary | ICD-10-CM | POA: Diagnosis not present

## 2021-06-30 DIAGNOSIS — R7301 Impaired fasting glucose: Secondary | ICD-10-CM | POA: Diagnosis not present

## 2021-07-01 ENCOUNTER — Other Ambulatory Visit (HOSPITAL_COMMUNITY): Payer: Self-pay | Admitting: Internal Medicine

## 2021-07-01 ENCOUNTER — Other Ambulatory Visit: Payer: Self-pay | Admitting: Internal Medicine

## 2021-07-01 DIAGNOSIS — R944 Abnormal results of kidney function studies: Secondary | ICD-10-CM

## 2021-07-14 ENCOUNTER — Ambulatory Visit (HOSPITAL_COMMUNITY)
Admission: RE | Admit: 2021-07-14 | Discharge: 2021-07-14 | Disposition: A | Discharge status: Home / Self Care | Payer: Medicare Other | Source: Ambulatory Visit | Attending: Internal Medicine | Admitting: Internal Medicine

## 2021-07-14 ENCOUNTER — Other Ambulatory Visit: Payer: Self-pay

## 2021-07-14 DIAGNOSIS — R944 Abnormal results of kidney function studies: Secondary | ICD-10-CM | POA: Diagnosis not present

## 2021-07-14 DIAGNOSIS — Z905 Acquired absence of kidney: Secondary | ICD-10-CM | POA: Diagnosis not present

## 2021-08-04 DIAGNOSIS — Z20822 Contact with and (suspected) exposure to covid-19: Secondary | ICD-10-CM | POA: Diagnosis not present

## 2021-08-22 ENCOUNTER — Telehealth: Payer: Self-pay | Admitting: Genetic Counselor

## 2021-08-22 ENCOUNTER — Encounter (INDEPENDENT_AMBULATORY_CARE_PROVIDER_SITE_OTHER): Payer: Self-pay | Admitting: Gastroenterology

## 2021-08-22 ENCOUNTER — Encounter (INDEPENDENT_AMBULATORY_CARE_PROVIDER_SITE_OTHER): Payer: Self-pay

## 2021-08-22 ENCOUNTER — Ambulatory Visit (INDEPENDENT_AMBULATORY_CARE_PROVIDER_SITE_OTHER): Payer: Medicare Other | Admitting: Gastroenterology

## 2021-08-22 ENCOUNTER — Other Ambulatory Visit (INDEPENDENT_AMBULATORY_CARE_PROVIDER_SITE_OTHER): Payer: Self-pay

## 2021-08-22 VITALS — BP 121/70 | HR 69 | Temp 98.1°F | Ht 71.0 in | Wt 216.8 lb

## 2021-08-22 DIAGNOSIS — Z1211 Encounter for screening for malignant neoplasm of colon: Secondary | ICD-10-CM | POA: Diagnosis not present

## 2021-08-22 DIAGNOSIS — R194 Change in bowel habit: Secondary | ICD-10-CM | POA: Insufficient documentation

## 2021-08-22 DIAGNOSIS — Z86010 Personal history of colonic polyps: Secondary | ICD-10-CM | POA: Diagnosis not present

## 2021-08-22 DIAGNOSIS — Z1371 Encounter for nonprocreative screening for genetic disease carrier status: Secondary | ICD-10-CM | POA: Diagnosis not present

## 2021-08-22 NOTE — Patient Instructions (Addendum)
-  We will Schedule you for colonoscopy ?-I will Obtain labs from PCP, will check Thyroid function if not done by Dr. Nevada Crane ( I will let you know if you need to do this) ?-Stay well hydrated with plenty of water, diet high in fruits, veggies and whole grains, kiwi and prunes are good for constipation ?-Start taking Miralax 1 capful every day for one week. If bowel movements do not improve, increase to 1 capful every 12 hours. If after two weeks there is no improvement, increase to 1 capful every 8 hours ?-we will also refer you to genetics for further evaluation as it would be beneficial for you to have genetic testing given your daughter is positive for the [facility] gene ?

## 2021-08-22 NOTE — Progress Notes (Signed)
? ?Referring Provider: [name], MD ?Primary Care Physician:  [name], MD ?Primary GI Physician: Jenetta Downer ? ?Chief Complaint  ?Patient presents with  ? constipation  ?  Patient on recall list for colonoscopy and has a change in bowel habits. Having constipation for about 6 months. Tried prunes and prune juice. Patient concerned because daughter had positive gene test for colorectal cancer. Would like to get colonoscopy with Dr. Laural Golden.   ? ?HPI:   ?Timothy Schroeder is a 76 y.o. male with past medical history of hypercholesteremia, HTN, Kidney donor.  ? ?Patient presenting today for colonoscopy recall/change in bowel habits.  ? ?States that for the past 6-7 months he has felt that he had a decreased urge to defecate with harder stools. States that his daughter had triple negative breast cancer not long ago and had genetic testing done that revealed she had monoallelic MUTYH mutation which increases the risks of CRC. His wife had previous genetic testing and was negative for this gene so he is concerned he is the carrier. Denies rectal bleeding or melena. Is taking stool softeners BID, initially taking just one a day but this was not providing good results. Felt that he had regular, once daily BMs prior to changes starting, sometimes may now go 2-3 days without a BM. States stools are more lumpy but denies any pencil thin stools or changes in stool color. Denies weight loss or changes in appetite. No nausea or vomiting. Denies any changes in medication. Unsure if he has had recent Thyroid testing with labs that PCP done in march. Trying to drink plenty of water, maybe 40-50 oz per day, and doing prune juice at night. Stools still are pretty hard, requiring straining.  ? ?No recent lab testing done with PCP. ? ?NSAID use: no NSAIDs ?Social hx: no etoh or tobacco  ?Fam hx: unsure of hx of CRC.  ? ?Last Colonoscopy:3/16/2016Examination performed to cecum. ?Small external hemorrhoids. ?No evidence of recurrent  polyps. ?Last Endoscopy:never ? ?Recommendations:  ?Repeat colonoscopy in March 2023 ? ?Past Medical History:  ?Diagnosis Date  ? Hypercholesteremia   ? Hypertension   ? Kidney donor 2003  ? Numbness   ? feet  ? ? ?Past Surgical History:  ?Procedure Laterality Date  ? [place]  ? COLONOSCOPY N/A 07/08/2014  ? Procedure: COLONOSCOPY;  Surgeon: [name], MD;  Location: [facility];  Service: Endoscopy;  Laterality: N/A;  1030 - moved to 11:55 - Ann to notify pt  ? NEPHRECTOMY  2003  ? Left  ? ? ?Current Outpatient Medications  ?Medication Sig Dispense Refill  ? Acetaminophen (TYLENOL EXTRA STRENGTH PO) Take by mouth. '500mg'$  2 tablets at bedtime    ? Ascorbic Acid (VITAMIN C) 1000 MG tablet Take 1,000 mg by mouth daily.    ? aspirin EC 81 MG tablet Take 81 mg by mouth daily.    ? Cholecalciferol (VITAMIN D3) 25 MCG (1000 UT) CAPS Take by mouth. One daily    ? Cyanocobalamin (B-12 PO) Take by mouth.    ? diclofenac sodium (VOLTAREN) 1 % GEL Apply 4 g topically 4 (four) times daily. 100 g 11  ? docusate sodium (COLACE) 100 MG capsule Take 100 mg by mouth 2 (two) times daily.    ? lisinopril (PRINIVIL,ZESTRIL) 20 MG tablet Take 20 mg by mouth daily.    ? pravastatin (PRAVACHOL) 40 MG tablet Take 40 mg by mouth at bedtime.    ? Saw Palmetto 450  MG CAPS Take by mouth. One twice daily    ? TURMERIC PO Take 800 mg by mouth daily.    ? zinc gluconate 50 MG tablet Take 50 mg by mouth daily.    ? ?No current facility-administered medications for this visit.  ? ? ?Allergies as of 08/22/2021  ? (No Known Allergies)  ? ? ?Family History  ?Problem Relation Age of Onset  ? Heart disease Mother   ? Stroke Father   ? ? ?Social History  ? ?Socioeconomic History  ? Marital status: Married  ?  Spouse name: Not on file  ? Number of children: 2  ? Years of education: college  ? Highest education level: Not on file  ?Occupational History  ? Occupation: Retired  ?Tobacco Use  ? Smoking status: Former  ?  Types: Cigarettes  ?   Quit date: 2002  ?  Years since quitting: 21.3  ?  Passive exposure: Past  ? Smokeless tobacco: Never  ?Vaping Use  ? Vaping Use: Never used  ?Substance and Sexual Activity  ? Alcohol use: Yes  ?  Comment: occasional  ? Drug use: Never  ? Sexual activity: Not on file  ?Other Topics Concern  ? Not on file  ?Social History Narrative  ? Lives at home with his wife.  ? Right-handed.  ? 2 cups caffeine per day.  ? ?Social Determinants of Health  ? ?Financial Resource Strain: Not on file  ?Food Insecurity: Not on file  ?Transportation Needs: Not on file  ?Physical Activity: Not on file  ?Stress: Not on file  ?Social Connections: Not on file  ? ?Review of systems ?General: negative for malaise, night sweats, fever, chills, weight loss ?Neck: Negative for lumps, goiter, pain and significant neck swelling ?Resp: Negative for cough, wheezing, dyspnea at rest ?CV: Negative for chest pain, leg swelling, palpitations, orthopnea ?GI: denies melena, hematochezia, nausea, vomiting, diarrhea, dysphagia, odyonophagia, early satiety or unintentional weight loss. +constipation ?MSK: Negative for joint pain or swelling, back pain, and muscle pain. ?Derm: Negative for itching or rash ?Psych: Denies depression, anxiety, memory loss, confusion. No homicidal or suicidal ideation.  ?Heme: Negative for prolonged bleeding, bruising easily, and swollen nodes. ?Endocrine: Negative for cold or heat intolerance, polyuria, polydipsia and goiter. ?Neuro: negative for tremor, gait imbalance, syncope and seizures. ?The remainder of the review of systems is noncontributory. ? ?Physical Exam: ?BP 121/70 (BP Location: Left Arm, Patient Position: Sitting, Cuff Size: Large)   Pulse 69   Temp 98.1 ?F (36.7 ?C) (Oral)   Ht '5\' 11"'$  (1.803 m)   Wt 216 lb 12.8 oz (98.3 kg)   BMI 30.24 kg/m?  ?General:   Alert and oriented. No distress noted. Pleasant and cooperative.  ?Head:  Normocephalic and atraumatic. ?Eyes:  Conjuctiva clear without scleral  icterus. ?Mouth:  Oral mucosa pink and moist. Good dentition. No lesions. ?Heart: Normal rate and rhythm, s1 and s2 heart sounds present.  ?Lungs: Clear lung sounds in all lobes. Respirations equal and unlabored. ?Abdomen:  +BS, soft, non-tender and non-distended. No rebound or guarding. No HSM or masses noted. ?Derm: No palmar erythema or jaundice ?Msk:  Symmetrical without gross deformities. Normal posture. ?Extremities:  Without edema. ?Neurologic:  Alert and  oriented x4 ?Psych:  Alert and cooperative. Normal mood and affect. ? ?Invalid input(s): 6 MONTHS  ? ?ASSESSMENT: ?MARQUIST BINSTOCK is a 76 y.o. male presenting today for new onset of constipation and to schedule repeat colonoscopy. ? ?New onset constipation about 6-7 months ago,  previously having 1 BM per day without issue. Now using stool softener BID and drinking prune juice, still continues with harder stools. No rectal bleeding, melena, weight loss or changes in appetite. He should continue with plenty of water, diet high in fruits, veggies and whole grains. Can start miralax, with dosing instructions as provided, will obtain labs from PCP to check for reversible causes, if no thyroid testing done, will order this for pt to have completed.  ? ?last colonoscopy was in 2016 and he is due for repeat surveillance colonoscopy given hx of adenomatous polyps. Will get him scheduled for this. Indications, risks and benefits of procedure discussed in detail with patient. Patient verbalized understanding and is in agreement to proceed with Colonoscopy at this time. ? ?Notably, patient's Daughter had recent genetic testing and was positive for monoallelic MUTYH gene, patient should have genetic testing to see if he is a carrier of this as well, as this could increase his risk of CRC. Will refer for genetic testing.  ? ?PLAN:  ?Schedule colonoscopy-endo 3 ?2. Obtain labs from PCP, will check TSH if not done ?3. Stay well hydrated, diet high in fruits, veggies and  whole grains, kiwi and prunes especially ?4. Start taking Miralax 1 capful every day for one week. If bowel movements do not improve, increase to 1 capful every 12 hours. If after two weeks there is no improvement, increase

## 2021-08-22 NOTE — Telephone Encounter (Signed)
Scheduled appt per 5/1 referral. Pt is aware of appt date and time. Pt is aware to arrive 15 mins prior to appt time and to bring and updated insurance card. Pt is aware of appt location.   ?

## 2021-08-23 ENCOUNTER — Encounter (INDEPENDENT_AMBULATORY_CARE_PROVIDER_SITE_OTHER): Payer: Self-pay

## 2021-08-26 DIAGNOSIS — Z20822 Contact with and (suspected) exposure to covid-19: Secondary | ICD-10-CM | POA: Diagnosis not present

## 2021-09-01 ENCOUNTER — Telehealth (INDEPENDENT_AMBULATORY_CARE_PROVIDER_SITE_OTHER): Payer: Self-pay

## 2021-09-01 MED ORDER — PEG 3350-KCL-NA BICARB-NACL 420 G PO SOLR: 4000.0000 mL | ORAL | 0 refills | Status: DC

## 2021-09-01 NOTE — Telephone Encounter (Signed)
Zarina Pe Ann Wendall Isabell, CMA  ?

## 2021-09-02 ENCOUNTER — Encounter (HOSPITAL_COMMUNITY)
Admission: RE | Admit: 2021-09-02 | Discharge: 2021-09-02 | Disposition: A | Discharge status: Home / Self Care | Payer: Medicare Other | Source: Ambulatory Visit | Attending: Internal Medicine | Admitting: Internal Medicine

## 2021-09-07 ENCOUNTER — Encounter (HOSPITAL_COMMUNITY)
Admission: RE | Disposition: A | Discharge status: Home / Self Care | Payer: Self-pay | Source: Home / Self Care | Attending: Internal Medicine

## 2021-09-07 ENCOUNTER — Ambulatory Visit (HOSPITAL_BASED_OUTPATIENT_CLINIC_OR_DEPARTMENT_OTHER): Payer: Medicare Other | Admitting: Anesthesiology

## 2021-09-07 ENCOUNTER — Encounter (HOSPITAL_COMMUNITY): Payer: Self-pay | Admitting: Internal Medicine

## 2021-09-07 ENCOUNTER — Ambulatory Visit (HOSPITAL_COMMUNITY)
Admission: RE | Admit: 2021-09-07 | Discharge: 2021-09-07 | Disposition: A | Discharge status: Home / Self Care | Payer: Medicare Other | Attending: Internal Medicine | Admitting: Internal Medicine

## 2021-09-07 ENCOUNTER — Ambulatory Visit (HOSPITAL_COMMUNITY): Payer: Medicare Other | Admitting: Anesthesiology

## 2021-09-07 DIAGNOSIS — K644 Residual hemorrhoidal skin tags: Secondary | ICD-10-CM | POA: Insufficient documentation

## 2021-09-07 DIAGNOSIS — G4730 Sleep apnea, unspecified: Secondary | ICD-10-CM | POA: Insufficient documentation

## 2021-09-07 DIAGNOSIS — K635 Polyp of colon: Secondary | ICD-10-CM

## 2021-09-07 DIAGNOSIS — D123 Benign neoplasm of transverse colon: Secondary | ICD-10-CM | POA: Insufficient documentation

## 2021-09-07 DIAGNOSIS — R194 Change in bowel habit: Secondary | ICD-10-CM

## 2021-09-07 DIAGNOSIS — M25569 Pain in unspecified knee: Secondary | ICD-10-CM

## 2021-09-07 DIAGNOSIS — S83249A Other tear of medial meniscus, current injury, unspecified knee, initial encounter: Secondary | ICD-10-CM

## 2021-09-07 DIAGNOSIS — Z86010 Personal history of colonic polyps: Secondary | ICD-10-CM

## 2021-09-07 DIAGNOSIS — M75111 Incomplete rotator cuff tear or rupture of right shoulder, not specified as traumatic: Secondary | ICD-10-CM

## 2021-09-07 DIAGNOSIS — K5730 Diverticulosis of large intestine without perforation or abscess without bleeding: Secondary | ICD-10-CM | POA: Diagnosis not present

## 2021-09-07 DIAGNOSIS — I10 Essential (primary) hypertension: Secondary | ICD-10-CM | POA: Insufficient documentation

## 2021-09-07 DIAGNOSIS — M170 Bilateral primary osteoarthritis of knee: Secondary | ICD-10-CM

## 2021-09-07 DIAGNOSIS — Z87891 Personal history of nicotine dependence: Secondary | ICD-10-CM | POA: Insufficient documentation

## 2021-09-07 DIAGNOSIS — M25511 Pain in right shoulder: Secondary | ICD-10-CM

## 2021-09-07 DIAGNOSIS — Z09 Encounter for follow-up examination after completed treatment for conditions other than malignant neoplasm: Secondary | ICD-10-CM | POA: Diagnosis not present

## 2021-09-07 DIAGNOSIS — G629 Polyneuropathy, unspecified: Secondary | ICD-10-CM

## 2021-09-07 DIAGNOSIS — M75101 Unspecified rotator cuff tear or rupture of right shoulder, not specified as traumatic: Secondary | ICD-10-CM

## 2021-09-07 DIAGNOSIS — Z1211 Encounter for screening for malignant neoplasm of colon: Secondary | ICD-10-CM | POA: Diagnosis not present

## 2021-09-07 HISTORY — PX: POLYPECTOMY: SHX149

## 2021-09-07 HISTORY — PX: COLONOSCOPY WITH PROPOFOL: SHX5780

## 2021-09-07 LAB — HM COLONOSCOPY

## 2021-09-07 SURGERY — COLONOSCOPY WITH PROPOFOL
Anesthesia: General

## 2021-09-07 MED ORDER — PROPOFOL 10 MG/ML IV BOLUS
INTRAVENOUS | Status: DC | PRN
Start: 2021-09-07 — End: 2021-09-07
  Administered 2021-09-07: 20 mg via INTRAVENOUS
  Administered 2021-09-07: 50 mg via INTRAVENOUS

## 2021-09-07 MED ORDER — LIDOCAINE HCL 1 % IJ SOLN
INTRAMUSCULAR | Status: DC | PRN
  Administered 2021-09-07: 50 mg via INTRADERMAL

## 2021-09-07 MED ORDER — PROPOFOL 500 MG/50ML IV EMUL
INTRAVENOUS | Status: DC | PRN
  Administered 2021-09-07: 150 ug/kg/min via INTRAVENOUS

## 2021-09-07 MED ORDER — LACTATED RINGERS IV SOLN: INTRAVENOUS | Status: DC

## 2021-09-07 NOTE — Discharge Instructions (Signed)
Resume aspirin on 09/08/2021 ?Resume other medications as before. ?High-fiber diet ?No driving for 24 hours. ?Physician will call with biopsy results. ?

## 2021-09-07 NOTE — H&P (Signed)
Timothy Schroeder is an 76 y.o. male.   ?Chief Complaint: Patient is here for colonoscopy. ?HPI: Patient is 76 year old Caucasian male with history of colonic adenoma and is here for surveillance colonoscopy.  His last exam was 7 years ago.  He did not have any polyps.  I therefore recommended waiting 7 years.  He denies abdominal pain change in bowel habits or rectal bleeding.  Family history is negative for colon cancer.  Last aspirin dose was over 4 days ago. ? ?Past Medical History:  ?Diagnosis Date  ? Hypercholesteremia   ? Hypertension   ? Kidney donor 2003  ? Numbness   ? feet  ? ? ?Past Surgical History:  ?Procedure Laterality Date  ? arthroscopy Left   ? left knee 2015  ? [place]  ? COLONOSCOPY N/A 07/08/2014  ? Procedure: COLONOSCOPY;  Surgeon: [name], MD;  Location: [facility];  Service: Endoscopy;  Laterality: N/A;  1030 - moved to 11:55 - Ann to notify pt  ? NEPHRECTOMY  2003  ? Left  ? ? ?Family History  ?Problem Relation Age of Onset  ? Heart disease Mother   ? Stroke Father   ? ?Social History:  reports that he quit smoking about 21 years ago. His smoking use included cigarettes. He has been exposed to tobacco smoke. He has never used smokeless tobacco. He reports current alcohol use. He reports that he does not use drugs. ? ?Allergies: No Known Allergies ? ?Medications Prior to Admission  ?Medication Sig Dispense Refill  ? acetaminophen (TYLENOL) 500 MG tablet Take 1,000 mg by mouth at bedtime.    ? Ascorbic Acid (VITAMIN C) 1000 MG tablet Take 1,000 mg by mouth in the morning.    ? aspirin EC 81 MG tablet Take 81 mg by mouth every evening.    ? Cholecalciferol (VITAMIN D3) 25 MCG (1000 UT) CAPS Take 1,000 Units by mouth in the morning.    ? Cyanocobalamin (B-12 PO) Take 2,500 mcg by mouth in the morning.    ? diclofenac Sodium (VOLTAREN) 1 % GEL Apply 1 application. topically at bedtime.    ? docusate sodium (COLACE) 100 MG capsule Take 100 mg by mouth 2 (two) times daily.    ?  lisinopril (PRINIVIL,ZESTRIL) 20 MG tablet Take 20 mg by mouth in the morning.    ? polyethylene glycol (MIRALAX / GLYCOLAX) 17 g packet Take 17 g by mouth in the morning.    ? polyethylene glycol-electrolytes (TRILYTE) 420 g solution Take 4,000 mLs by mouth as directed. 4000 mL 0  ? pravastatin (PRAVACHOL) 40 MG tablet Take 40 mg by mouth every evening.    ? Saw Palmetto 450 MG CAPS Take 450 mg by mouth in the morning and at bedtime.    ? TURMERIC PO Take 800 mg by mouth in the morning.    ? zinc gluconate 50 MG tablet Take 50 mg by mouth in the morning.    ? ? ?No results found for this or any previous visit (from the past 48 hour(s)). ?No results found. ? ?Review of Systems ? ?Blood pressure 125/80, pulse 79, temperature 98.5 ?F (36.9 ?C), temperature source Oral, resp. rate 20, height '5\' 11"'$  (1.803 m), weight 98.3 kg, SpO2 95 %. ?Physical Exam ?HENT:  ?   Mouth/Throat:  ?   Mouth: Mucous membranes are moist.  ?   Pharynx: Oropharynx is clear.  ?Eyes:  ?   General: No scleral icterus. ?   Conjunctiva/sclera:  Conjunctivae normal.  ?Cardiovascular:  ?   Rate and Rhythm: Normal rate and regular rhythm.  ?   Heart sounds: Normal heart sounds. No murmur heard. ?Pulmonary:  ?   Effort: Pulmonary effort is normal.  ?   Breath sounds: Normal breath sounds.  ?Abdominal:  ?   General: There is no distension.  ?   Palpations: Abdomen is soft. There is no mass.  ?   Tenderness: There is no abdominal tenderness.  ?Musculoskeletal:     ?   General: No swelling.  ?   Cervical back: Neck supple.  ?Lymphadenopathy:  ?   Cervical: No cervical adenopathy.  ?Skin: ?   General: Skin is warm and dry.  ?Neurological:  ?   Mental Status: He is alert.  ?  ? ?Assessment/Plan ? ?History of colonic adenoma. ?Surveillance colonoscopy. ? ?Hildred Laser, MD ?09/07/2021, 2:08 PM ? ? ? ?

## 2021-09-07 NOTE — Anesthesia Postprocedure Evaluation (Signed)
Anesthesia Post Note ? ?Patient: Timothy Schroeder ? ?Procedure(s) Performed: COLONOSCOPY WITH PROPOFOL ?POLYPECTOMY INTESTINAL ? ?Patient location during evaluation: PACU ?Anesthesia Type: General ?Level of consciousness: awake and alert ?Pain management: pain level controlled ?Vital Signs Assessment: post-procedure vital signs reviewed and stable ?Respiratory status: spontaneous breathing ?Cardiovascular status: blood pressure returned to baseline and stable ?Postop Assessment: no apparent nausea or vomiting ?Anesthetic complications: no ? ? ?No notable events documented. ? ? ?Last Vitals:  ?Vitals:  ? 09/07/21 1441 09/07/21 1443  ?BP: (!) 91/55 (!) 91/55  ?Pulse: 85   ?Resp: 18   ?Temp: 36.8 ?C   ?SpO2: 96%   ?  ?Last Pain:  ?Vitals:  ? 09/07/21 1441  ?TempSrc: Oral  ?PainSc: 0-No pain  ? ? ?  ?  ?  ?  ?  ?  ? ?Maddock Finigan ? ? ? ? ?

## 2021-09-07 NOTE — Anesthesia Preprocedure Evaluation (Addendum)
Anesthesia Evaluation  ?Patient identified by MRN, date of birth, ID band ?Patient awake ? ? ? ?Reviewed: ?Allergy & Precautions, NPO status , Patient's Chart, lab work & pertinent test results ? ?Airway ?Mallampati: II ? ?TM Distance: >3 FB ?Neck ROM: Full ? ? ? Dental ? ?(+) Dental Advisory Given, Teeth Intact ?  ?Pulmonary ?sleep apnea and Continuous Positive Airway Pressure Ventilation , former smoker,  ?  ?Pulmonary exam normal ?breath sounds clear to auscultation ? ? ? ? ? ? Cardiovascular ?Exercise Tolerance: Good ?hypertension, Pt. on medications ?Normal cardiovascular exam ?Rhythm:Regular Rate:Normal ? ? ?  ?Neuro/Psych ? Neuromuscular disease negative psych ROS  ? GI/Hepatic ?negative GI ROS, Neg liver ROS,   ?Endo/Other  ?negative endocrine ROS ? Renal/GU ?Renal disease (patient has only one kidney, Donated kidney to his relative)  ?negative genitourinary ?  ?Musculoskeletal ? ?(+) Arthritis , Osteoarthritis,   ? Abdominal ?  ?Peds ?negative pediatric ROS ?(+)  Hematology ?negative hematology ROS ?(+)   ?Anesthesia Other Findings ? ? Reproductive/Obstetrics ?negative OB ROS ? ?  ? ? ? ? ? ? ? ? ? ? ? ? ? ?  ?  ? ? ? ? ? ?Anesthesia Physical ?Anesthesia Plan ? ?ASA: 3 ? ?Anesthesia Plan: General  ? ?Post-op Pain Management: Minimal or no pain anticipated  ? ?Induction: Intravenous ? ?PONV Risk Score and Plan: Propofol infusion ? ?Airway Management Planned: Nasal Cannula and Natural Airway ? ?Additional Equipment:  ? ?Intra-op Plan:  ? ?Post-operative Plan:  ? ?Informed Consent: I have reviewed the patients History and Physical, chart, labs and discussed the procedure including the risks, benefits and alternatives for the proposed anesthesia with the patient or authorized representative who has indicated his/her understanding and acceptance.  ? ? ? ?Dental advisory given ? ?Plan Discussed with: CRNA and Surgeon ? ?Anesthesia Plan Comments:   ? ? ? ? ? ?Anesthesia Quick  Evaluation ? ?

## 2021-09-07 NOTE — Op Note (Signed)
[facility] ?Patient Name: Timothy Schroeder ?Procedure Date: 09/07/2021 1:56 PM ?MRN: [id number] ?Date of Birth: 1946/02/20 ?Attending MD: Hildred Laser , MD ?CSN: [id number] ?Age: 76 ?Admit Type: Outpatient ?Procedure:                Colonoscopy ?Indications:              High risk colon cancer surveillance: Personal  ?                          history of colonic polyps ?Providers:                Hildred Laser, MD, Janeece Riggers, RN, [place] [facility] ?Referring MD:             Delphina Cahill, MD ?Medicines:                Propofol per Anesthesia ?Complications:            No immediate complications. ?Estimated Blood Loss:     Estimated blood loss was minimal. ?Procedure:                Pre-Anesthesia Assessment: ?                          - Prior to the procedure, a History and Physical  ?                          was performed, and patient medications and  ?                          allergies were reviewed. The patient's tolerance of  ?                          previous anesthesia was also reviewed. The risks  ?                          and benefits of the procedure and the sedation  ?                          options and risks were discussed with the patient.  ?                          All questions were answered, and informed consent  ?                          was obtained. Prior Anticoagulants: The patient has  ?                          taken no previous anticoagulant or antiplatelet  ?                          agents except for aspirin. ASA Grade Assessment: II  ?                          - A patient with mild systemic disease. After  ?  reviewing the risks and benefits, the patient was  ?                          deemed in satisfactory condition to undergo the  ?                          procedure. ?                          After obtaining informed consent, the colonoscope  ?                          was passed under direct vision. Throughout the  ?                          procedure, the  patient's blood pressure, pulse, and  ?                          oxygen saturations were monitored continuously. The  ?                          PCF-HQ190L ([id number]) scope was introduced through  ?                          the anus and advanced to the the cecum, identified  ?                          by appendiceal orifice and ileocecal valve. The  ?                          colonoscopy was performed without difficulty. The  ?                          patient tolerated the procedure well. The quality  ?                          of the bowel preparation was good. The ileocecal  ?                          valve, appendiceal orifice, and rectum were  ?                          photographed. ?Scope In: 2:19:57 PM ?Scope Out: 2:37:12 PM ?Scope Withdrawal Time: 0 hours 13 minutes 56 seconds  ?Total Procedure Duration: 0 hours 17 minutes 15 seconds  ?Findings: ?     The perianal and digital rectal examinations were normal. ?     Two flat polyps were found in the splenic flexure and proximal  ?     transverse colon. The polyps were small in size. These polyps were  ?     removed with a cold snare. Resection and retrieval were complete. The  ?     pathology specimen was placed into Bottle Number 1. ?     A few diverticula were found in the sigmoid colon. ?     External hemorrhoids were found during  retroflexion. The hemorrhoids  ?     were small. ?Impression:               - Two small polyps at the splenic flexure and in  ?                          the proximal transverse colon, removed with a cold  ?                          snare. Resected and retrieved. ?                          - Diverticulosis in the sigmoid colon. ?                          - External hemorrhoids. ?Moderate Sedation: ?     Per Anesthesia Care ?Recommendation:           - Patient has a contact number available for  ?                          emergencies. The signs and symptoms of potential  ?                          delayed complications were  discussed with the  ?                          patient. Return to normal activities tomorrow.  ?                          Written discharge instructions were provided to the  ?                          patient. ?                          - High fiber diet today. ?                          - Continue present medications. ?                          - No aspirin, ibuprofen, naproxen, or other  ?                          non-steroidal anti-inflammatory drugs for 1 day. ?                          - Await pathology results. ?                          - Repeat colonoscopy is recommended. The  ?                          colonoscopy date will be determined after pathology  ?  results from today's exam become available for  ?                          review. ?Procedure Code(s):        --- Professional --- ?                          [phone], Colonoscopy, flexible; with removal of  ?                          tumor(s), polyp(s), or other lesion(s) by snare  ?                          technique ?Diagnosis Code(s):        --- Professional --- ?                          Z86.010, Personal history of colonic polyps ?                          K63.5, Polyp of colon ?                          K64.4, Residual hemorrhoidal skin tags ?                          K57.30, Diverticulosis of large intestine without  ?                          perforation or abscess without bleeding ?CPT copyright 2019 American Medical Association. All rights reserved. ?The codes documented in this report are preliminary and upon coder review may  ?be revised to meet current compliance requirements. ?Hildred Laser, MD ?Hildred Laser, MD ?09/07/2021 2:44:56 PM ?This report has been signed electronically. ?Number of Addenda: 0 ?

## 2021-09-07 NOTE — Transfer of Care (Signed)
Immediate Anesthesia Transfer of Care Note ? ?Patient: Timothy Schroeder ? ?Procedure(s) Performed: COLONOSCOPY WITH PROPOFOL ?POLYPECTOMY INTESTINAL ? ?Patient Location: PACU and Endoscopy Unit ? ?Anesthesia Type:General ? ?Level of Consciousness: awake ? ?Airway & Oxygen Therapy: Patient Spontanous Breathing ? ?Post-op Assessment: Report given to RN ? ?Post vital signs: Reviewed and stable ? ?Last Vitals:  ?Vitals Value Taken Time  ?BP 91/55 09/07/21 1443  ?Temp 36.8 ?C 09/07/21 1441  ?Pulse 85 09/07/21 1441  ?Resp 18 09/07/21 1441  ?SpO2 96 % 09/07/21 1441  ? ? ?Last Pain:  ?Vitals:  ? 09/07/21 1441  ?TempSrc: Oral  ?PainSc: 0-No pain  ?   ? ?  ? ?Complications: No notable events documented. ?

## 2021-09-08 ENCOUNTER — Encounter (INDEPENDENT_AMBULATORY_CARE_PROVIDER_SITE_OTHER): Payer: Self-pay | Admitting: *Deleted

## 2021-09-09 LAB — SURGICAL PATHOLOGY

## 2021-09-13 ENCOUNTER — Encounter (HOSPITAL_COMMUNITY): Payer: Self-pay | Admitting: Internal Medicine

## 2021-09-21 ENCOUNTER — Inpatient Hospital Stay: Payer: Medicare Other

## 2021-09-21 ENCOUNTER — Other Ambulatory Visit: Payer: Self-pay | Admitting: Genetic Counselor

## 2021-09-21 ENCOUNTER — Other Ambulatory Visit: Payer: Self-pay

## 2021-09-21 ENCOUNTER — Inpatient Hospital Stay: Payer: Medicare Other | Attending: Genetic Counselor | Admitting: Genetic Counselor

## 2021-09-21 DIAGNOSIS — Z86010 Personal history of colonic polyps: Secondary | ICD-10-CM

## 2021-09-21 DIAGNOSIS — D126 Benign neoplasm of colon, unspecified: Secondary | ICD-10-CM | POA: Diagnosis not present

## 2021-09-21 DIAGNOSIS — Z800 Family history of malignant neoplasm of digestive organs: Secondary | ICD-10-CM

## 2021-09-21 DIAGNOSIS — K635 Polyp of colon: Secondary | ICD-10-CM | POA: Diagnosis not present

## 2021-09-21 LAB — GENETIC SCREENING ORDER

## 2021-09-22 ENCOUNTER — Encounter: Payer: Self-pay | Admitting: Genetic Counselor

## 2021-09-22 DIAGNOSIS — Z800 Family history of malignant neoplasm of digestive organs: Secondary | ICD-10-CM | POA: Insufficient documentation

## 2021-09-22 DIAGNOSIS — K635 Polyp of colon: Secondary | ICD-10-CM | POA: Insufficient documentation

## 2021-09-23 ENCOUNTER — Encounter: Payer: Self-pay | Admitting: Genetic Counselor

## 2021-09-23 DIAGNOSIS — Z800 Family history of malignant neoplasm of digestive organs: Secondary | ICD-10-CM | POA: Insufficient documentation

## 2021-09-23 NOTE — Progress Notes (Signed)
REFERRING PROVIDER: Gabriel Rung, NP [phone] S. [address] [place],  [place] [address]  PRIMARY PROVIDER:  [name], MD  PRIMARY REASON FOR VISIT:  1. Adenomatous polyp of colon, unspecified part of colon   2. Family history of colon cancer      HISTORY OF PRESENT ILLNESS:   Timothy Schroeder, a 76 y.o. male, was seen for a [facility] cancer genetics consultation at the request of Scherrie Gerlach, NP due to a personal history of colon polyps, family history of colon cancer and his daughter who was found to have a single pathogenic mutation in MUTYH.  Timothy Schroeder presents to clinic today to discuss the possibility of a hereditary predisposition to cancer, genetic testing, and to further clarify his future cancer risks, as well as potential cancer risks for family members.   Timothy Schroeder is a 76 y.o. male with no personal history of cancer.  He has a history of 3 tubular adenomas, for which he is followed every 3-5 years with colonoscopy.  His daughter had TN breast cancer at 42 and had genetic testing.  She was found to have a single, monoallelic MUTYH pathogenic variant.  CANCER HISTORY:  Oncology History   No history exists.    Past Medical History:  Diagnosis Date   Colon polyps    Family history of colon cancer    Family history of malignant neoplasm of gastrointestinal tract    Hypercholesteremia    Hypertension    Kidney donor 2003   Numbness    feet    Past Surgical History:  Procedure Laterality Date   arthroscopy Left    left knee 2015   [place]   COLONOSCOPY N/A 07/08/2014   Procedure: COLONOSCOPY;  Surgeon: [name], MD;  Location: [facility];  Service: Endoscopy;  Laterality: N/A;  1030 - moved to 11:55 - Ann to notify pt   COLONOSCOPY WITH PROPOFOL N/A 09/07/2021   Procedure: COLONOSCOPY WITH PROPOFOL;  Surgeon: [name], MD;  Location: [facility];  Service: Endoscopy;  Laterality: N/A;  120 ASA 2   NEPHRECTOMY  2003   Left    POLYPECTOMY  09/07/2021   Procedure: POLYPECTOMY INTESTINAL;  Surgeon: [name], MD;  Location: [facility];  Service: Endoscopy;;    Social History   Socioeconomic History   Marital status: Married    Spouse name: Not on file   Number of children: 2   Years of education: college   Highest education level: Not on file  Occupational History   Occupation: Retired  Tobacco Use   Smoking status: Former    Types: Cigarettes    Quit date: 2002    Years since quitting: 21.4    Passive exposure: Past   Smokeless tobacco: Never  Vaping Use   Vaping Use: Never used  Substance and Sexual Activity   Alcohol use: Yes    Comment: occasional   Drug use: Never   Sexual activity: Not on file  Other Topics Concern   Not on file  Social History Narrative   Lives at home with his wife.   Right-handed.   2 cups caffeine per day.   Social Determinants of Health   Financial Resource Strain: Not on file  Food Insecurity: Not on file  Transportation Needs: Not on file  Physical Activity: Not on file  Stress: Not on file  Social Connections: Not on file     FAMILY HISTORY:  We  obtained a detailed, 4-generation family history.  Significant diagnoses are listed below: Family History  Problem Relation Age of Onset   Heart disease Mother    Stroke Father    Stroke Brother    Other Maternal Uncle        partial colectomy for unknown reason   Colon cancer Maternal Grandmother    Breast cancer Daughter 40       TN, GT = single MUTYH    The patient has two children.  His daughter was diagnosed with triple negative breast cancer at age 73 and was found to have a single pathogenic MUTYH mutation.  His wife is negative for this mutation.  The patient has one brother who died of a stroke.  Both parents are deceased.  The patient's father died of congestive heart failure at 71.  He had two brothers and a sister who were cancer free.  His parents died of non cancer related  issues.  The patient's mother died at 37 from congestive heart failure.  She had three sisters and a brother.  The brother had a partial colectomy for an unknown reason.  The maternal grandparents are deceased.  The grandmother had colon cancer.  Timothy Schroeder is aware of previous family history of genetic testing for hereditary cancer risks. Patient's maternal ancestors are of [place] descent, and paternal ancestors are of [place] descent. There is no reported Ashkenazi Jewish ancestry. There is no known consanguinity.  GENETIC COUNSELING ASSESSMENT: Timothy Schroeder is a 75 y.o. male with a personal history of colon polyps, family history of colon cancer and a known pathogenic single MUTYH mutation running in the family which is somewhat suggestive of a hereditary polyposis syndrome and predisposition to cancer given the known MUTYH mutation. We, therefore, discussed and recommended the following at today's visit.   DISCUSSION: We discussed that,about 1-2% of the population carries a single pathogenic mutation in MUTYH.  These individuals are unaffected carriers of MYH-associated polyposis (MAP) syndrome.  But they are unaffected. Only when an individual has two pathogenic mutations in MUTYH are they affected. This would include 100's-1000's of colon polyps and a greatly increased risk for colon cancer at a young age.    MAP is a recessive condition.  Individuals who carry one pathogenic mutation are not at increased risk for cancer and do not have recommended screening management, based on the NCCN 1.2023 guidelines.  If these individuals have colon polyps, they should be followed based on the size and number of polyps.    That said, individuals who carry a single pathogenic mutation have a 1/200-1/400 chance of having a child with MAP syndrome.  Therefore, their children should undergo genetic testing in their early 20's to determine not only their carrier status, but also if they are affected with this condition  so that their medical management can be changed to lower their risk for getting cancer.  Timothy Schroeder wife has undergone genetic testing and was negative.  We discussed that this testing will most likely confirm that he is a carrier of this mutation.  We recommended that his son, niece and nephew also undergo genetic testing to confirm their carrier status.  Additionally, cousins could consider this testing as well.      PLAN: After considering the risks, benefits, and limitations, Timothy Schroeder provided informed consent to pursue genetic testing and the blood sample was sent to [facility] for analysis of the CancerNext-Expanded+RNAinsight panel.. Results should be available within approximately 2-3 weeks' time,  at which point they will be disclosed by telephone to Timothy Schroeder, as will any additional recommendations warranted by these results. Timothy Schroeder will receive a summary of his genetic counseling visit and a copy of his results once available. This information will also be available in Epic.   Lastly, we encouraged Timothy Schroeder to remain in contact with cancer genetics annually so that we can continuously update the family history and inform him of any changes in cancer genetics and testing that may be of benefit for this family.   Timothy Schroeder questions were answered to his satisfaction today. Our contact information was provided should additional questions or concerns arise. Thank you for the referral and allowing [place] to share in the care of your patient.   Kanton Kamel P. Florene Glen, [place], [facility] Licensed, Insurance risk surveyor [email or web address] phone: [phone]  The patient was seen for a total of 35 minutes in face-to-face genetic counseling.  The patient was seen alone.  This patient was discussed with Drs. Magrinat, Lindi Adie and/or Burr Medico who agrees with the above.    _______________________________________________________________________ For Office Staff:  Number of people  involved in session: 1 Was an Intern/ student involved with case: no

## 2021-10-05 ENCOUNTER — Encounter: Payer: Self-pay | Admitting: Genetic Counselor

## 2021-10-05 ENCOUNTER — Ambulatory Visit: Payer: Self-pay | Admitting: Genetic Counselor

## 2021-10-05 ENCOUNTER — Telehealth: Payer: Self-pay | Admitting: Genetic Counselor

## 2021-10-05 DIAGNOSIS — Z1379 Encounter for other screening for genetic and chromosomal anomalies: Secondary | ICD-10-CM

## 2021-10-05 NOTE — Progress Notes (Signed)
HPI: Timothy Schroeder. Timothy Schroeder was previously seen in the [place] clinic due to a family of cancer and concerns regarding a hereditary predisposition to cancer. Please refer to our prior cancer genetics clinic note for more information regarding Timothy Schroeder. Timothy Schroeder's medical, social and family histories, and our assessment and recommendations, at the time. Timothy Schroeder. Timothy Schroeder recent genetic test results were disclosed to her, as were recommendations warranted by these results. These results and recommendations are discussed in more detail below.   FAMILY HISTORY:  We obtained a detailed, 4-generation family history.  Significant diagnoses are listed below: Family History  Problem Relation Age of Onset   Heart disease Mother    Stroke Father    Stroke Brother    Other Maternal Uncle        partial colectomy for unknown reason   Colon cancer Maternal Grandmother    Breast cancer Daughter 33       TN, GT = single MUTYH     The patient has two children.  His daughter was diagnosed with triple negative breast cancer at age 71 and was found to have a single pathogenic MUTYH mutation.  His wife is negative for this mutation.  The patient has one brother who died of a stroke.  Both parents are deceased.   The patient's father died of congestive heart failure at 21.  He had two brothers and a sister who were cancer free.  His parents died of non cancer related issues.   The patient's mother died at 55 from congestive heart failure.  She had three sisters and a brother.  The brother had a partial colectomy for an unknown reason.  The maternal grandparents are deceased.  The grandmother had colon cancer.   Timothy Schroeder is aware of previous family history of genetic testing for hereditary cancer risks. Patient's maternal ancestors are of [place] descent, and paternal ancestors are of [place] descent. There is no reported Ashkenazi Jewish ancestry. There is no known consanguinity.  GENETIC TEST RESULTS: At the time of  Timothy Schroeder's visit, we recommended he pursue genetic testing of the CancerNext-Expanded+RNAinsight. This test, which included sequencing and deletion/duplication analysis of the genes listed on the test report, was performed at [facility]. Timothy Schroeder was called today with his genetic test results. Genetic testing identified a single, heterozygous pathogenic gene mutation called MUTYH, c.1187G>A.  Since Timothy Schroeder has only one pathogenic mutation in MUTYH, he is NOT affected with MYH-associated polyposis, but instead is a carrier. A copy of the test report has been scanned into Epic and is located under the Molecular Pathology section of the Results Review tab.     Genetic testing did identify a variant of uncertain significance (VUS) was identified in the DICER1 gene called c.1187G>A.  At this time, it is unknown if this variant is associated with increased cancer risk or if this is a normal finding, but most variants such as this get reclassified to being inconsequential. It should not be used to make medical management decisions. With time, we suspect the lab will determine the significance of this variant, if any. If we do learn more about it, we will try to contact Timothy Schroeder to discuss it further. However, it is important to stay in touch with [place] periodically and keep the address and phone number up to date.  SCREENING RECOMMENDATIONS: We discussed the implications of a heterozygous MUTYH mutation for Timothy Schroeder, and discussed who else in the family should have genetic testing. We recommended  Timothy Schroeder follow the most updated medical management guidelines (NCCN Guidelines v1.2023) for heterozygous MUTYH mutations; all of which are outlined below. These can be coordinated by Timothy Schroeder's GI doctor or her primary provider.   Personal history or first-degree family history of colon cancer or polyps:  Increased screening as per the relevant NCCN guideline.  Do not have a personal  history of colon cancer and do not have a parent/sibling/child with colon cancer: General population screening is appropriate for these individuals.  FAMILY MEMBERS: Since we now know the mutation in Timothy Schroeder, we can test at-risk relatives to determine whether or not they have inherited the mutation and are at increased risk for cancer. We will be happy to meet with any of the family members or refer them to a genetic counselor in their local area. To locate genetic counselors in other cities, individuals can visit the website of the [employer] (ArtistMovie.se) and Secretary/administrator for a Social worker by zip code.   Timothy Schroeder children and siblings have a 50% chance to have inherited this mutation. We recommend they have genetic testing for this same mutation, as identifying the presence of this mutation would allow them to also take advantage of risk-reducing measures.   We strongly encouraged Timothy Schroeder to remain in contact with [place] in cancer genetics on an annual basis so we can update Timothy Schroeder. [place]'s personal and family histories, and inform him of advances in cancer genetics that may be of benefit for the entire family. Timothy Schroeder. [place] knows he is also welcome to call with any questions or concerns, at any time.   Roma Kayser, [place], [facility]  Licensed, Certified Genetic Counselor [email or web address] phone: [phone]

## 2021-10-05 NOTE — Telephone Encounter (Signed)
Revealed that patient is a carrier of a single pathogenic variant in MUTYH.  This is the same variant seen in his daughter.  Medical management will not be changed based on this variant, but instead on the polyp number and size.  One VUS in DICER1 was also identified.  This also will not change medical management.

## 2021-11-02 ENCOUNTER — Encounter (INDEPENDENT_AMBULATORY_CARE_PROVIDER_SITE_OTHER): Payer: Self-pay

## 2023-10-17 IMAGING — US US RENAL
1 series · 14 of 25 positions shown · non-contrast
Comparison: None.

CLINICAL DATA: Abnormal renal function studies. Status post
donation of left kidney in 2554.

EXAM:
RENAL / URINARY TRACT ULTRASOUND COMPLETE

[Series 1: us renal · 14 of 29 slices shown]
[im 1/29]
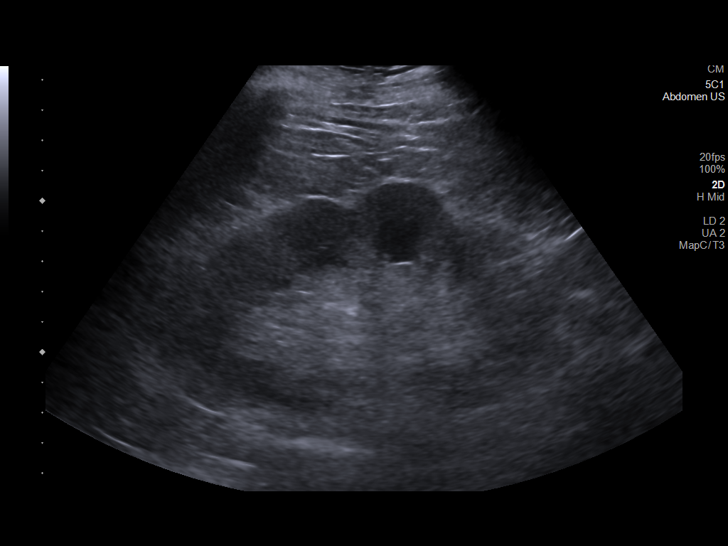
[im 3/29]
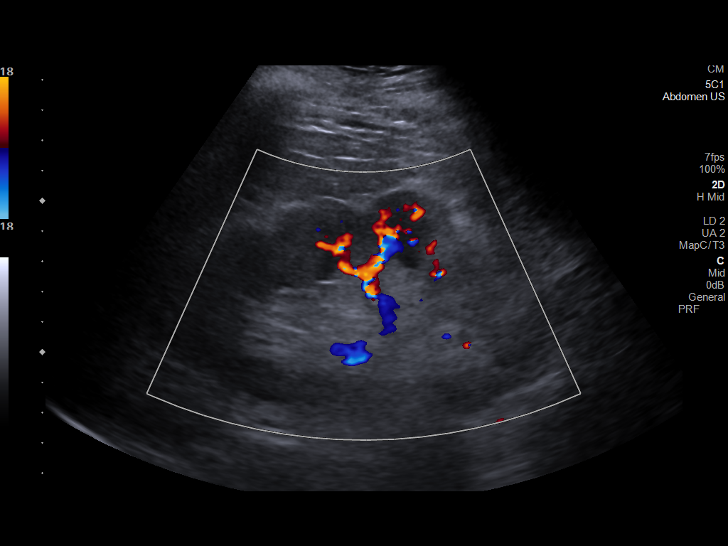
[im 5/29]
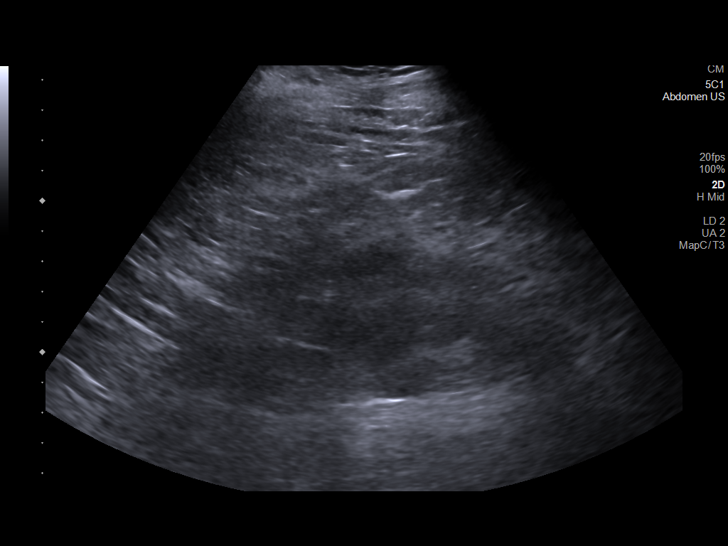
[im 8/29]
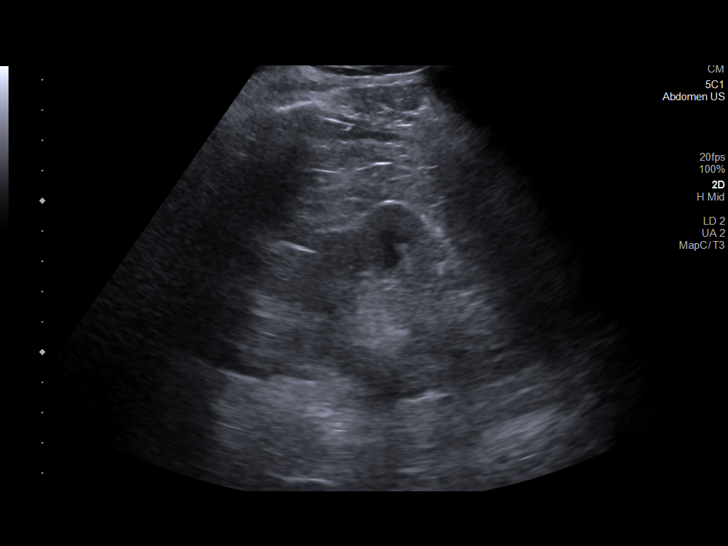
[im 10/29]
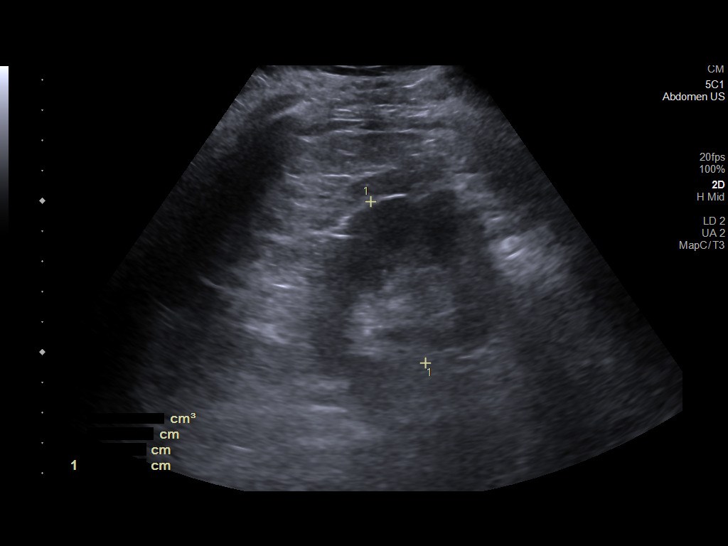
[im 11/29]
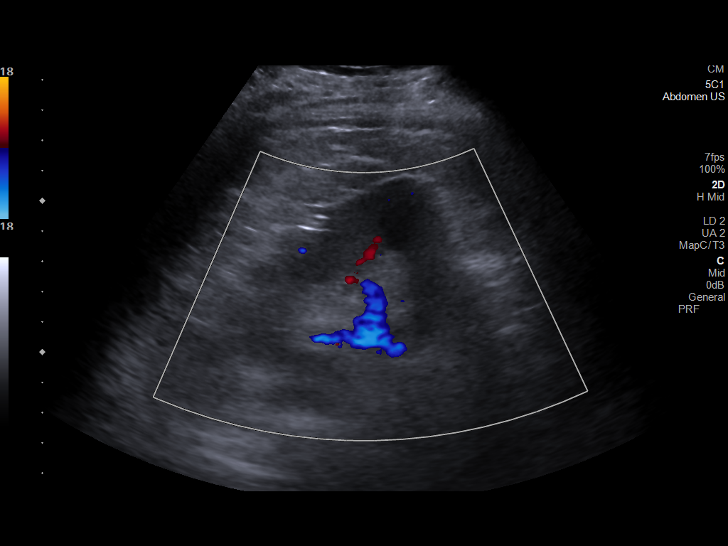
[im 13/29]
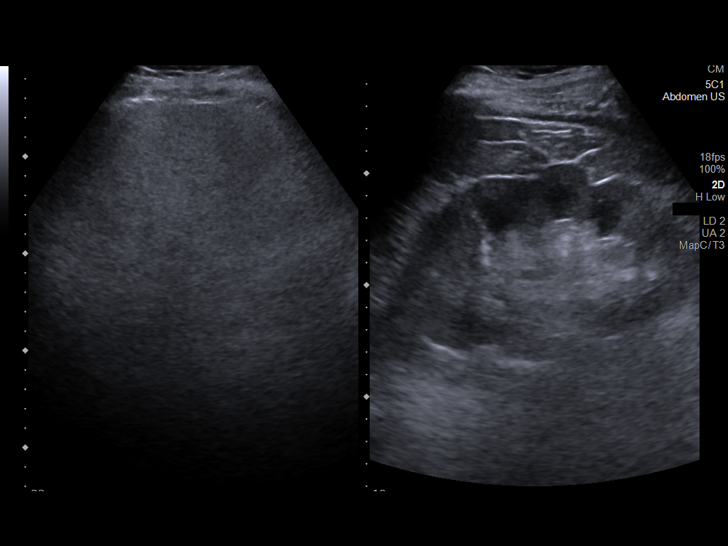
[im 16/29]
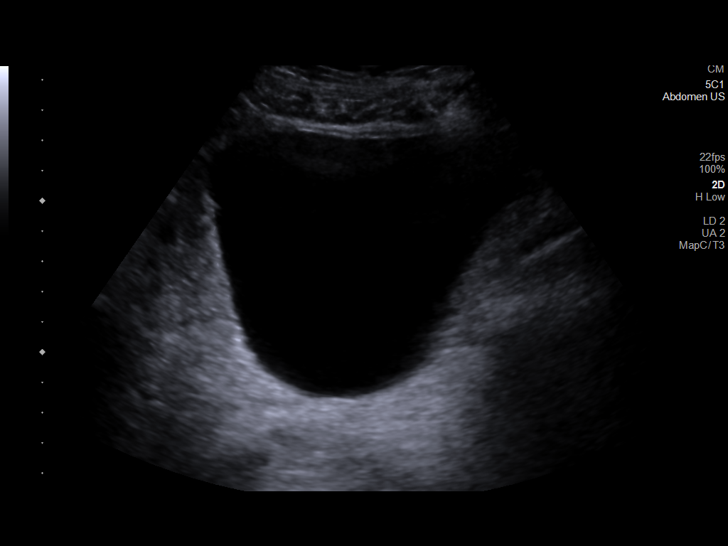
[im 18/29]
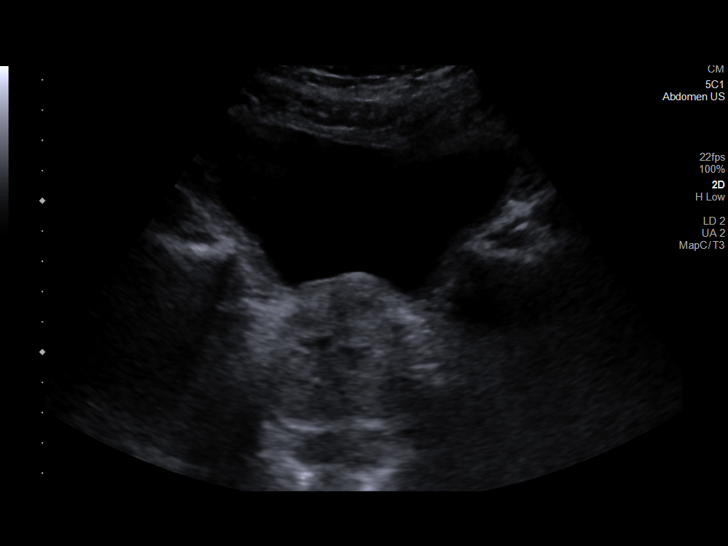
[im 19/29]
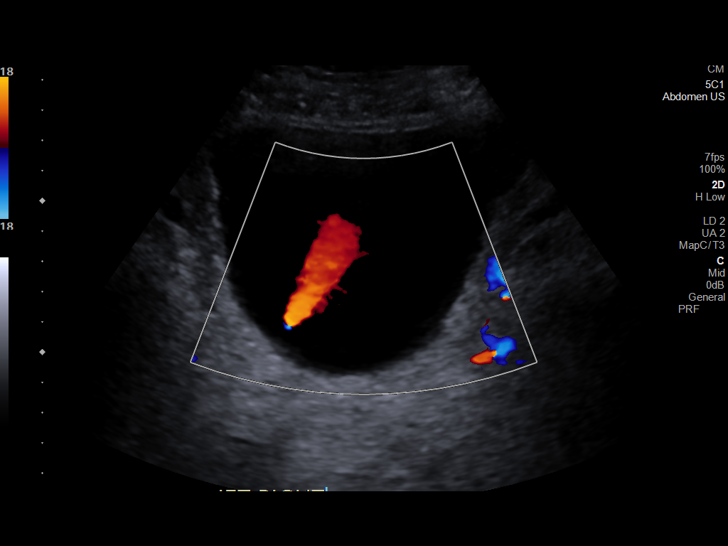
[im 22/29]
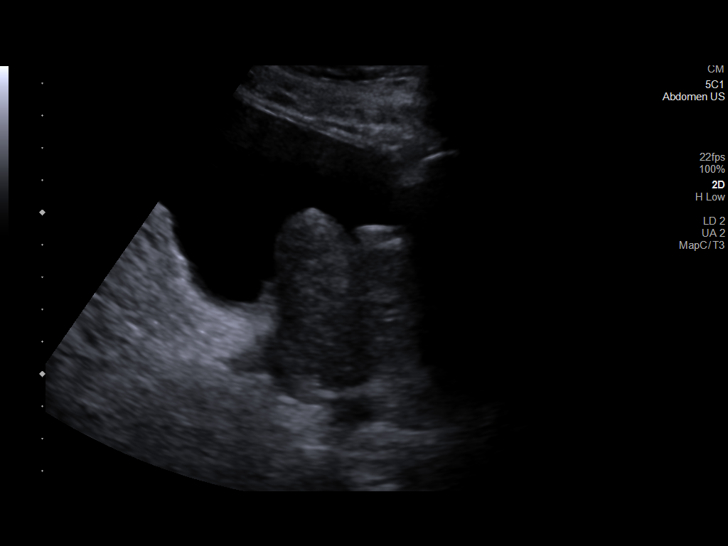
[im 24/29]
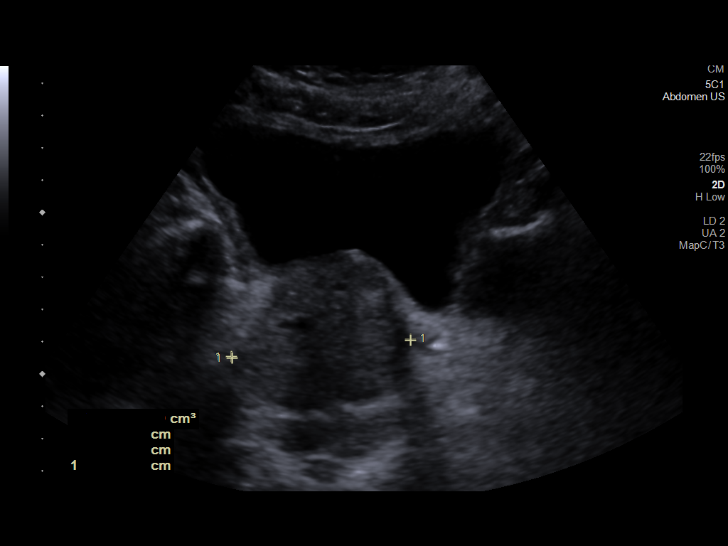
[im 26/29]
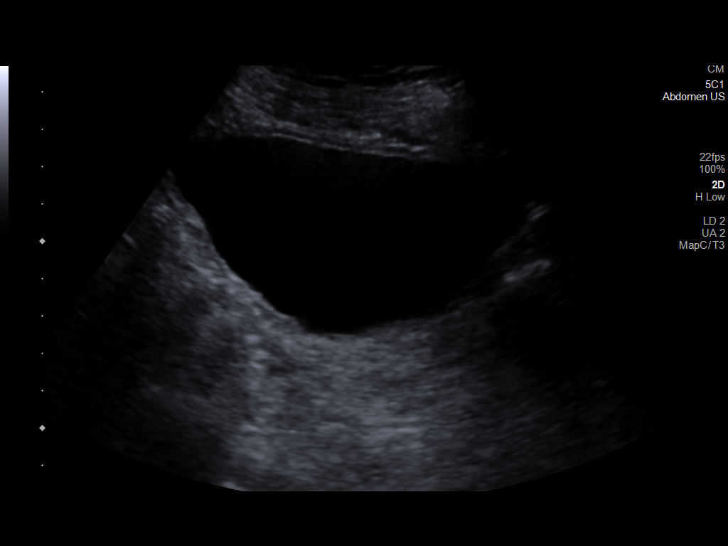
[im 29/29]
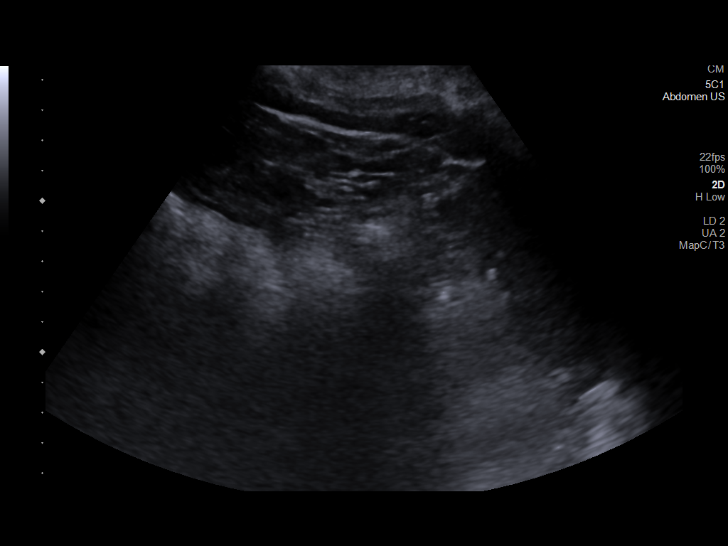

[14 of 25 positions shown; findings below may reference images not displayed]

FINDINGS: Right Kidney:

Renal measurements: 13.6 cm x 7.5 cm x 5.6 cm = volume: 302.51 mL.
Echogenicity within normal limits. No mass or hydronephrosis
visualized.

Left Kidney:

The left kidney is surgically absent.

Bladder:

Appears normal for degree of bladder distention. The right ureteral
jet is visualized.

Other:

None.
IMPRESSION: 1. Findings consistent with prior LEFT nephrectomy.
2. Normal ultrasonographic appearance of the RIGHT kidney and
urinary bladder.
